# Patient Record
Sex: Female | Born: 1990 | Race: White | Hispanic: No | Marital: Married
Health system: Midwestern US, Community
[De-identification: ages and names within clinical notes are randomized; demographics above are authoritative.]

## PROBLEM LIST (undated history)

## (undated) DIAGNOSIS — E229 Hyperfunction of pituitary gland, unspecified: Secondary | ICD-10-CM

## (undated) DIAGNOSIS — E221 Hyperprolactinemia: Secondary | ICD-10-CM

## (undated) DIAGNOSIS — U071 COVID-19: Secondary | ICD-10-CM

## (undated) DIAGNOSIS — D497 Neoplasm of unspecified behavior of endocrine glands and other parts of nervous system: Secondary | ICD-10-CM

## (undated) HISTORY — PX: NO PAST SURGERIES: SHX2092

---

## 2013-08-31 ENCOUNTER — Encounter

## 2013-09-11 MED ORDER — GADOBUTROL 7.5 MMOL/7.5 ML (1 MMOL/ML) IV
7.5 mmol/ mL (1 mmol/mL) | Freq: Once | INTRAVENOUS | Status: AC
Start: 2013-09-11 — End: 2013-09-11
  Administered 2013-09-11: 15:00:00 via INTRAVENOUS

## 2013-09-11 MED ORDER — SODIUM CHLORIDE 0.9% BOLUS IV
0.9 % | Freq: Once | INTRAVENOUS | Status: AC
Start: 2013-09-11 — End: 2013-09-11
  Administered 2013-09-11: 15:00:00 via INTRAVENOUS

## 2015-10-07 ENCOUNTER — Encounter

## 2015-11-13 ENCOUNTER — Ambulatory Visit: Payer: BLUE CROSS/BLUE SHIELD | Primary: Emergency Medicine

## 2018-01-17 ENCOUNTER — Ambulatory Visit: Admit: 2018-01-17 | Discharge: 2018-01-17 | Payer: PRIVATE HEALTH INSURANCE | Primary: Emergency Medicine

## 2018-01-17 DIAGNOSIS — H6691 Otitis media, unspecified, right ear: Secondary | ICD-10-CM

## 2018-01-17 LAB — AMB POC RAPID STREP A: Group A Strep Ag: NEGATIVE

## 2018-01-17 LAB — AMB POC SOFIA INFLUENZA A/B TEST
Influenza A Ag POC: NEGATIVE Pos/Neg
Influenza B Ag POC: NEGATIVE Pos/Neg

## 2018-01-17 MED ORDER — AMOXICILLIN 875 MG TAB
875 mg | ORAL_TABLET | Freq: Two times a day (BID) | ORAL | 0 refills | Status: AC
Start: 2018-01-17 — End: 2018-01-27

## 2018-01-17 NOTE — Patient Instructions (Addendum)
Sore Throat: Care Instructions  Your Care Instructions    Infection by bacteria or a virus causes most sore throats. Cigarette smoke, dry air, air pollution, allergies, and yelling can also cause a sore throat. Sore throats can be painful and annoying. Fortunately, most sore throats go away on their own. If you have a bacterial infection, your doctor may prescribe antibiotics.  Follow-up care is a key part of your treatment and safety. Be sure to make and go to all appointments, and call your doctor if you are having problems. It's also a good idea to know your test results and keep a list of the medicines you take.  How can you care for yourself at home?  ?? If your doctor prescribed antibiotics, take them as directed. Do not stop taking them just because you feel better. You need to take the full course of antibiotics.  ?? Gargle with warm salt water once an hour to help reduce swelling and relieve discomfort. Use 1 teaspoon of salt mixed in 1 cup of warm water.  ?? Take an over-the-counter pain medicine, such as acetaminophen (Tylenol), ibuprofen (Advil, Motrin), or naproxen (Aleve). Read and follow all instructions on the label.  ?? Be careful when taking over-the-counter cold or flu medicines and Tylenol at the same time. Many of these medicines have acetaminophen, which is Tylenol. Read the labels to make sure that you are not taking more than the recommended dose. Too much acetaminophen (Tylenol) can be harmful.  ?? Drink plenty of fluids. Fluids may help soothe an irritated throat. Hot fluids, such as tea or soup, may help decrease throat pain.  ?? Use over-the-counter throat lozenges to soothe pain. Regular cough drops or hard candy may also help. These should not be given to young children because of the risk of choking.  ?? Do not smoke or allow others to smoke around you. If you need help quitting, talk to your doctor about stop-smoking programs and medicines.  These can increase your chances of quitting for good.  ?? Use a vaporizer or humidifier to add moisture to your bedroom. Follow the directions for cleaning the machine.  When should you call for help?  Call your doctor now or seek immediate medical care if:  ?? ?? You have new or worse trouble swallowing.   ?? ?? Your sore throat gets much worse on one side.   ??Watch closely for changes in your health, and be sure to contact your doctor if you do not get better as expected.  Where can you learn more?  Go to [employer].  Enter [phone] in the search box to learn more about "Sore Throat: Care Instructions."  Current as of: December 07, 2016  Content Version: 11.9  ?? 2006-2018 [employer]. Care instructions adapted under license by [employer] (which disclaims liability or warranty for this information). If you have questions about a medical condition or this instruction, always ask your healthcare professional. [employer] disclaims any warranty or liability for your use of this information.         Ear Infection (Otitis Media): Care Instructions  Your Care Instructions    An ear infection may start with a cold and affect the middle ear (otitis media). It can hurt a lot. Most ear infections clear up on their own in a couple of days. Most often you will not need antibiotics. This is because many ear infections are caused by a virus. Antibiotics don't work against a virus. Regular doses of  pain medicines are the best way to reduce your fever and help you feel better.  Follow-up care is a key part of your treatment and safety. Be sure to make and go to all appointments, and call your doctor if you are having problems. It's also a good idea to know your test results and keep a list of the medicines you take.  How can you care for yourself at home?  ?? Take pain medicines exactly as directed.  ? If the doctor gave you a prescription medicine for pain, take it as  prescribed.  ? If you are not taking a prescription pain medicine, take an over-the-counter medicine, such as acetaminophen (Tylenol), ibuprofen (Advil, Motrin), or naproxen (Aleve). Read and follow all instructions on the label.  ? Do not take two or more pain medicines at the same time unless the doctor told you to. Many pain medicines have acetaminophen, which is Tylenol. Too much acetaminophen (Tylenol) can be harmful.  ?? Plan to take a full dose of pain reliever before bedtime. Getting enough sleep will help you get better.  ?? Try a warm, moist washcloth on the ear. It may help relieve pain.  ?? If your doctor prescribed antibiotics, take them as directed. Do not stop taking them just because you feel better. You need to take the full course of antibiotics.  When should you call for help?  Call your doctor now or seek immediate medical care if:  ?? ?? You have new or increasing ear pain.   ?? ?? You have new or increasing pus or blood draining from your ear.   ?? ?? You have a fever with a stiff neck or a severe headache.   ??Watch closely for changes in your health, and be sure to contact your doctor if:  ?? ?? You have new or worse symptoms.   ?? ?? You are not getting better after taking an antibiotic for 2 days.   Where can you learn more?  Go to http://www.healthwise.net/[employer].  Enter X558 in the search box to learn more about "Ear Infection (Otitis Media): Care Instructions."  Current as of: December 07, 2016  Content Version: 11.9  ?? 2006-2018 [employer]. Care instructions adapted under license by [employer] (which disclaims liability or warranty for this information). If you have questions about a medical condition or this instruction, always ask your healthcare professional. [employer] disclaims any warranty or liability for your use of this information.

## 2018-01-17 NOTE — Progress Notes (Signed)
Cold Symptoms   The history is provided by the patient. This is a new problem. The current episode started yesterday. The problem occurs constantly. The problem has been gradually worsening. The cough is productive of sputum. Patient reports a subjective fever - was not measured.The fever has been present for less than 1 day. Associated symptoms include chills, ear congestion, ear pain, rhinorrhea and sore throat. Pertinent negatives include no chest pain, no sweats, no headaches, no myalgias, no shortness of breath, no wheezing, no nausea and no vomiting. Treatments tried: sudafed. The treatment provided no relief. She is not a smoker.        No past medical history on file.     No past surgical history on file.      No family history on file.     Social History     Socioeconomic History   ??? Marital status: MARRIED     Spouse name: Not on file   ??? Number of children: Not on file   ??? Years of education: Not on file   ??? Highest education level: Not on file   Occupational History   ??? Not on file   Social Needs   ??? Financial resource strain: Not on file   ??? Food insecurity:     Worry: Not on file     Inability: Not on file   ??? Transportation needs:     Medical: Not on file     Non-medical: Not on file   Tobacco Use   ??? Smoking status: Never Smoker   ??? Smokeless tobacco: Never Used   Substance and Sexual Activity   ??? Alcohol use: Not on file   ??? Drug use: Not on file   ??? Sexual activity: Not on file   Lifestyle   ??? Physical activity:     Days per week: Not on file     Minutes per session: Not on file   ??? Stress: Not on file   Relationships   ??? Social connections:     Talks on phone: Not on file     Gets together: Not on file     Attends religious service: Not on file     Active member of club or organization: Not on file     Attends meetings of clubs or organizations: Not on file     Relationship status: Not on file   ??? Intimate partner violence:     Fear of current or ex partner: Not on file      Emotionally abused: Not on file     Physically abused: Not on file     Forced sexual activity: Not on file   Other Topics Concern   ??? Not on file   Social History Narrative   ??? Not on file                ALLERGIES: Patient has no known allergies.    Review of Systems   Constitutional: Positive for chills. Negative for activity change, appetite change and fever.   HENT: Positive for congestion, ear pain, rhinorrhea, sinus pressure, sinus pain and sore throat. Negative for trouble swallowing.    Respiratory: Negative for cough, shortness of breath and wheezing.    Cardiovascular: Negative for chest pain and palpitations.   Gastrointestinal: Negative for nausea and vomiting.   Musculoskeletal: Negative for myalgias.   Neurological: Negative for dizziness and headaches.   Hematological: Positive for adenopathy.       Vitals:  01/17/18 1859   BP: 111/64   Pulse: (!) 114   Resp: 20   Temp: 99.6 ??F (37.6 ??C)   SpO2: 98%   Weight: 164 lb (74.4 kg)   Height:  (1.753 m)       Physical Exam   Constitutional: She appears well-developed and well-nourished. No distress.   HENT:   Right Ear: External ear and ear canal normal. Tympanic membrane is erythematous and bulging. A middle ear effusion is present.   Left Ear: Tympanic membrane, external ear and ear canal normal.   Nose: Rhinorrhea present. Right sinus exhibits no maxillary sinus tenderness and no frontal sinus tenderness. Left sinus exhibits no maxillary sinus tenderness and no frontal sinus tenderness.   Mouth/Throat: Mucous membranes are normal. Posterior oropharyngeal edema and posterior oropharyngeal erythema present. No oropharyngeal exudate or tonsillar abscesses.   Cardiovascular: Normal rate, regular rhythm and normal heart sounds.   Pulmonary/Chest: Effort normal and breath sounds normal. No respiratory distress. She has no wheezes. She has no rales.   Lymphadenopathy:     She has cervical adenopathy.   Neurological: She is alert.    Skin: She is not diaphoretic.   Psychiatric: She has a normal mood and affect. Her behavior is normal. Judgment and thought content normal.   Nursing note and vitals reviewed.      MDM    ICD-10-CM ICD-9-CM    1. Acute right otitis media H66.91 382.9    2. Sore throat J02.9 462 AMB POC RAPID STREP A      AMB POC SOFIA INFLUENZA A/B TEST      UPPER RESPIRATORY CULTURE     Medications Ordered Today   Medications   ??? amoxicillin (AMOXIL) 875 mg tablet     Sig: Take 1 Tab by mouth every twelve (12) hours for 10 days.     Dispense:  20 Tab     Refill:  0     OTC Mucinex prn    The patients condition was discussed with the patient and they understand.  The patient is to follow up with PCP [facility]. If signs and symptoms become worse the pt is to go to the ER. The patient is to take medications as prescribed.       Results for orders placed or performed in visit on 01/17/18   AMB POC RAPID STREP A   Result Value Ref Range    VALID INTERNAL CONTROL POC Yes     Group A Strep Ag Negative Negative   AMB POC SOFIA INFLUENZA A/B TEST   Result Value Ref Range    VALID INTERNAL CONTROL POC Yes     Influenza A Ag POC Negative Negative Pos/Neg    Influenza B Ag POC Negative Negative Pos/Neg         Procedures

## 2018-01-21 LAB — UPPER RESPIRATORY CULTURE

## 2018-06-18 ENCOUNTER — Ambulatory Visit: Admit: 2018-06-18 | Payer: PRIVATE HEALTH INSURANCE | Primary: Emergency Medicine

## 2018-06-18 ENCOUNTER — Ambulatory Visit: Primary: Emergency Medicine

## 2018-06-18 DIAGNOSIS — J029 Acute pharyngitis, unspecified: Secondary | ICD-10-CM

## 2018-06-18 LAB — AMB POC RAPID STREP A
Group A Strep Ag: NEGATIVE
Group A Strep Antigen, POC: NEGATIVE

## 2018-06-18 MED ORDER — PREDNISONE 20 MG TAB
20 mg | ORAL_TABLET | ORAL | 0 refills | Status: DC
Start: 2018-06-18 — End: 2020-11-02

## 2018-06-18 NOTE — Patient Instructions (Signed)
Follow up with PCP without improvement over next 5-7 days sooner/immediately for new or worsening      Results for orders placed or performed in visit on 06/18/18   AMB POC RAPID STREP A   Result Value Ref Range    VALID INTERNAL CONTROL POC Yes     Group A Strep Ag Negative Negative          Sore Throat: Care Instructions  Your Care Instructions    Infection by bacteria or a virus causes most sore throats. Cigarette smoke, dry air, air pollution, allergies, and yelling can also cause a sore throat. Sore throats can be painful and annoying. Fortunately, most sore throats go away on their own. If you have a bacterial infection, your doctor may prescribe antibiotics.  Follow-up care is a key part of your treatment and safety. Be sure to make and go to all appointments, and call your doctor if you are having problems. It's also a good idea to know your test results and keep a list of the medicines you take.  How can you care for yourself at home?  ?? If your doctor prescribed antibiotics, take them as directed. Do not stop taking them just because you feel better. You need to take the full course of antibiotics.  ?? Gargle with warm salt water once an hour to help reduce swelling and relieve discomfort. Use 1 teaspoon of salt mixed in 1 cup of warm water.  ?? Take an over-the-counter pain medicine, such as acetaminophen (Tylenol), ibuprofen (Advil, Motrin), or naproxen (Aleve). Read and follow all instructions on the label.  ?? Be careful when taking over-the-counter cold or flu medicines and Tylenol at the same time. Many of these medicines have acetaminophen, which is Tylenol. Read the labels to make sure that you are not taking more than the recommended dose. Too much acetaminophen (Tylenol) can be harmful.  ?? Drink plenty of fluids. Fluids may help soothe an irritated throat. Hot fluids, such as tea or soup, may help decrease throat pain.  ?? Use over-the-counter throat lozenges to soothe pain. Regular cough drops  or hard candy may also help. These should not be given to young children because of the risk of choking.  ?? Do not smoke or allow others to smoke around you. If you need help quitting, talk to your doctor about stop-smoking programs and medicines. These can increase your chances of quitting for good.  ?? Use a vaporizer or humidifier to add moisture to your bedroom. Follow the directions for cleaning the machine.  When should you call for help?  Call your doctor now or seek immediate medical care if:  ?? ?? You have new or worse trouble swallowing.   ?? ?? Your sore throat gets much worse on one side.   ??Watch closely for changes in your health, and be sure to contact your doctor if you do not get better as expected.  Where can you learn more?  Go to [employer]://www.healthwise.net/[employer].  Enter [phone] in the search box to learn more about "Sore Throat: Care Instructions."  Current as of: July 03, 2017  Content Version: 12.2  ?? 2006-2019 [employer]. Care instructions adapted under license by [employer] (which disclaims liability or warranty for this information). If you have questions about a medical condition or this instruction, always ask your healthcare professional. [employer] disclaims any warranty or liability for your use of this information.

## 2018-06-18 NOTE — Progress Notes (Signed)
27 yo female here for 2 days of sore throat  Pain 3/10. Worse in evenings. Improved throughout the day.  Tolerating food and fluids.  Associated: "mild cough" without SOB or wheeze  hasnt had relief with home therapies  Denies fever, chills, rashes or headache  Overall not improving.           History reviewed. No pertinent past medical history.     History reviewed. No pertinent surgical history.      History reviewed. No pertinent family history.     Social History     Socioeconomic History   ??? Marital status: MARRIED     Spouse name: Not on file   ??? Number of children: Not on file   ??? Years of education: Not on file   ??? Highest education level: Not on file   Occupational History   ??? Not on file   Social Needs   ??? Financial resource strain: Not on file   ??? Food insecurity:     Worry: Not on file     Inability: Not on file   ??? Transportation needs:     Medical: Not on file     Non-medical: Not on file   Tobacco Use   ??? Smoking status: Never Smoker   ??? Smokeless tobacco: Never Used   Substance and Sexual Activity   ??? Alcohol use: Not on file   ??? Drug use: Not on file   ??? Sexual activity: Not on file   Lifestyle   ??? Physical activity:     Days per week: Not on file     Minutes per session: Not on file   ??? Stress: Not on file   Relationships   ??? Social connections:     Talks on phone: Not on file     Gets together: Not on file     Attends religious service: Not on file     Active member of club or organization: Not on file     Attends meetings of clubs or organizations: Not on file     Relationship status: Not on file   ??? Intimate partner violence:     Fear of current or ex partner: Not on file     Emotionally abused: Not on file     Physically abused: Not on file     Forced sexual activity: Not on file   Other Topics Concern   ??? Not on file   Social History Narrative   ??? Not on file                ALLERGIES: Patient has no known allergies.    Review of Systems   Gastrointestinal: Negative for nausea and vomiting.    Skin: Negative for rash.   All other systems reviewed and are negative.      Vitals:    06/18/18 0828   BP: 108/59   Pulse: 88   Resp: 16   Temp: 98 ??F (36.7 ??C)   SpO2: 98%   Weight: 163 lb (73.9 kg)   Height: 5\' 9"  (1.753 m)       Physical Exam   Constitutional: She is oriented to person, place, and time. No distress.   Non-toxic appearing, well hydrated   HENT:   OP erythema without swelling, exudate or lesion. TMs normal bilat. Mild decrease in nasal patency. Uvula midline.   Eyes: Pupils are equal, round, and reactive to light. Conjunctivae and EOM are normal. No scleral icterus.  Neck: Normal range of motion. Neck supple.   Cardiovascular: Normal rate, regular rhythm and normal heart sounds. Exam reveals no gallop and no friction rub.   No murmur heard.  Pulmonary/Chest: Effort normal and breath sounds normal. No respiratory distress. She has no wheezes. She has no rales.   Lymphadenopathy:     She has no cervical adenopathy.   Neurological: She is alert and oriented to person, place, and time.   Skin: Skin is warm and dry. No rash noted. She is not diaphoretic. No erythema. No pallor.   Psychiatric: She has a normal mood and affect. Her behavior is normal. Thought content normal.   Nursing note and vitals reviewed.      MDM     Differential Diagnosis; Clinical Impression; Plan:       CLINICAL IMPRESSION:  (J02.9) Acute pharyngitis, unspecified etiology  (primary encounter diagnosis)    Orders Placed This Encounter      predniSONE (DELTASONE) 20 mg tablet          Sig: Take 2 tabs by mouth one time daily with food for 3 days.          Dispense:  6 Tab          Refill:  0      Plan:  Rapid strep negative  Prednisone with food x 3 days for symptomatic relief  No indication for throat culture today  Soothing, fluids, lozenges  Note given for work  Review handouts    We have reviewed concerning signs/symptoms, normal vs abnormal progression of medical condition and when to seek immediate medical attention.   Schedule with PCP or Urgent Care immediately for worsening or new symptoms.  See your PCP if there is not at least some improvement in symptoms within the next 5-7 days.  You should see your PCP for updates on your routine health maintenance.             Procedures

## 2018-06-18 NOTE — Progress Notes (Signed)
27 yo female here for 2 days of sore throat  Pain 3/10. Worse in evenings. Improved throughout the day.  Tolerating food and fluids.  Associated: "mild cough" without SOB or wheeze  hasnt had relief with home therapies  Denies fever, chills, rashes or headache  Overall not improving.           History reviewed. No pertinent past medical history.     History reviewed. No pertinent surgical history.      History reviewed. No pertinent family history.     Social History     Socioeconomic History   ??? Marital status: MARRIED     Spouse name: Not on file   ??? Number of children: Not on file   ??? Years of education: Not on file   ??? Highest education level: Not on file   Occupational History   ??? Not on file   Social Needs   ??? Financial resource strain: Not on file   ??? Food insecurity:     Worry: Not on file     Inability: Not on file   ??? Transportation needs:     Medical: Not on file     Non-medical: Not on file   Tobacco Use   ??? Smoking status: Never Smoker   ??? Smokeless tobacco: Never Used   Substance and Sexual Activity   ??? Alcohol use: Not on file   ??? Drug use: Not on file   ??? Sexual activity: Not on file   Lifestyle   ??? Physical activity:     Days per week: Not on file     Minutes per session: Not on file   ??? Stress: Not on file   Relationships   ??? Social connections:     Talks on phone: Not on file     Gets together: Not on file     Attends religious service: Not on file     Active member of club or organization: Not on file     Attends meetings of clubs or organizations: Not on file     Relationship status: Not on file   ??? Intimate partner violence:     Fear of current or ex partner: Not on file     Emotionally abused: Not on file     Physically abused: Not on file     Forced sexual activity: Not on file   Other Topics Concern   ??? Not on file   Social History Narrative   ??? Not on file                ALLERGIES: Patient has no known allergies.    Review of Systems   Gastrointestinal: Negative for nausea and vomiting.    Skin: Negative for rash.   All other systems reviewed and are negative.      Vitals:    06/18/18 0828   BP: 108/59   Pulse: 88   Resp: 16   Temp: 98 ??F (36.7 ??C)   SpO2: 98%   Weight: 163 lb (73.9 kg)   Height: 5\' 9"  (1.753 m)       Physical Exam   Constitutional: She is oriented to person, place, and time. No distress.   Non-toxic appearing, well hydrated   HENT:   OP erythema without swelling, exudate or lesion. TMs normal bilat. Mild decrease in nasal patency. Uvula midline.   Eyes: Pupils are equal, round, and reactive to light. Conjunctivae and EOM are normal. No scleral icterus.  Neck: Normal range of motion. Neck supple.   Cardiovascular: Normal rate, regular rhythm and normal heart sounds. Exam reveals no gallop and no friction rub.   No murmur heard.  Pulmonary/Chest: Effort normal and breath sounds normal. No respiratory distress. She has no wheezes. She has no rales.   Lymphadenopathy:     She has no cervical adenopathy.   Neurological: She is alert and oriented to person, place, and time.   Skin: Skin is warm and dry. No rash noted. She is not diaphoretic. No erythema. No pallor.   Psychiatric: She has a normal mood and affect. Her behavior is normal. Thought content normal.   Nursing note and vitals reviewed.      MDM     Differential Diagnosis; Clinical Impression; Plan:       CLINICAL IMPRESSION:  (J02.9) Acute pharyngitis, unspecified etiology  (primary encounter diagnosis)    Orders Placed This Encounter      predniSONE (DELTASONE) 20 mg tablet          Sig: Take 2 tabs by mouth one time daily with food for 3 days.          Dispense:  6 Tab          Refill:  0      Plan:  Rapid strep negative  Prednisone with food x 3 days for symptomatic relief  No indication for throat culture today  Soothing, fluids, lozenges  Note given for work  Review handouts    We have reviewed concerning signs/symptoms, normal vs abnormal progression of medical condition and when to seek immediate medical  attention.  Schedule with PCP or Urgent Care immediately for worsening or new symptoms.  See your PCP if there is not at least some improvement in symptoms within the next 5-7 days.  You should see your PCP for updates on your routine health maintenance.             Procedures

## 2019-03-26 ENCOUNTER — Ambulatory Visit: Admit: 2019-03-26 | Discharge: 2019-03-26 | Payer: PRIVATE HEALTH INSURANCE | Primary: Emergency Medicine

## 2019-03-26 ENCOUNTER — Ambulatory Visit: Primary: Emergency Medicine

## 2019-03-26 DIAGNOSIS — Z1159 Encounter for screening for other viral diseases: Secondary | ICD-10-CM

## 2019-03-26 NOTE — Progress Notes (Signed)
I spoke with the patient/guardian advised of lab results and physician recommendation/instructions.

## 2019-03-26 NOTE — Progress Notes (Signed)
Please inform of negative COVID-19 test.

## 2019-03-26 NOTE — Progress Notes (Signed)
OFFICE NOTE    Nicole Morrow is a 28 y.o. female presenting today for the following:  Chief Complaint   Patient presents with   ??? Covid Testing     pt reports she needs re tested for COVID due to having a positive test          HPI     Patient previously tested positive for COVID and needs to be retested.  Patient denies any abnormal medical symptoms at this time.  Patient denies any SOB or Fever.      Review of Systems   Constitutional: Negative for chills, fatigue and fever.   HENT: Negative.    Respiratory: Negative.    Cardiovascular: Negative.    Gastrointestinal: Negative.    Musculoskeletal: Negative.    Neurological: Negative.          History  History reviewed. No pertinent past medical history.    History reviewed. No pertinent surgical history.    Social History     Socioeconomic History   ??? Marital status: MARRIED     Spouse name: Not on file   ??? Number of children: Not on file   ??? Years of education: Not on file   ??? Highest education level: Not on file   Occupational History   ??? Not on file   Social Needs   ??? Financial resource strain: Not on file   ??? Food insecurity     Worry: Not on file     Inability: Not on file   ??? Transportation needs     Medical: Not on file     Non-medical: Not on file   Tobacco Use   ??? Smoking status: Never Smoker   ??? Smokeless tobacco: Never Used   Substance and Sexual Activity   ??? Alcohol use: Not on file   ??? Drug use: Not on file   ??? Sexual activity: Not on file   Lifestyle   ??? Physical activity     Days per week: Not on file     Minutes per session: Not on file   ??? Stress: Not on file   Relationships   ??? Social Wellsite geologistconnections     Talks on phone: Not on file     Gets together: Not on file     Attends religious service: Not on file     Active member of club or organization: Not on file     Attends meetings of clubs or organizations: Not on file     Relationship status: Not on file   ??? Intimate partner violence     Fear of current or ex partner: Not on file      Emotionally abused: Not on file     Physically abused: Not on file     Forced sexual activity: Not on file   Other Topics Concern   ??? Not on file   Social History Narrative   ??? Not on file       No Known Allergies    Current Outpatient Medications   Medication Sig Dispense Refill   ??? prenatal vit/iron fum/folic ac (PRENATAL 1+1 PO) Prenatal     ??? predniSONE (DELTASONE) 20 mg tablet Take 2 tabs by mouth one time daily with food for 3 days. 6 Tab 0   ??? buPROPion XL (WELLBUTRIN XL) 300 mg XL tablet TAKE 1 TABLET BY MOUTH EVERY DAY  4   ??? levonorgestrel-ethinyl estradiol (LEVORA-28) 0.15-0.03 mg tab Levora-28 0.15 mg-0.03 mg tablet  1 po qd     ??? cabergoline (DOSTINEX) 0.5 mg tablet TAKE 1/2 A TABLET BY MOUTH 2 TIMES A WEEK  3         Patient Care Team:  No care team member to display      LABS:  No results found for any visits on 03/26/19.     RADIOLOGY:  No recent results      Physical Exam  Constitutional:       Appearance: Normal appearance. She is well-developed.   Cardiovascular:      Rate and Rhythm: Normal rate.      Heart sounds: Normal heart sounds.   Pulmonary:      Effort: Pulmonary effort is normal.      Breath sounds: Normal breath sounds.   Neurological:      Mental Status: She is alert and oriented to person, place, and time.   Psychiatric:         Mood and Affect: Mood normal.           Vitals:    03/26/19 1330   Pulse: 86   Resp: 16   Temp: 98.8 ??F (37.1 ??C)   SpO2: 97%   LMP: 03/26/2019         Assessment and Plan    MDM  Number of Diagnoses or Management Options  Screening for viral disease:   Diagnosis management comments: (Z11.59) Screening for viral disease  (primary encounter diagnosis)  No orders of the defined types were placed in this encounter.    Tested patient for COVID-19. Patient given education material.  The condition was discussed with the patient and they understand.  If symptoms worsen the pt is to go to the ER. Advised patient to take tylenol for  discomfort.  Advised to quarantine self.      This patient was seen in Flu Clinic at [facility] while in their vehicle due to COVID-19 pandemic with PPE and focused examination in order to decrease community viral transmission.     The patient/guardian gave verbal consent to treat.                    Procedures

## 2019-03-26 NOTE — Progress Notes (Signed)
OFFICE NOTE    Nicole Morrow is a 28 y.o. female presenting today for the following:  Chief Complaint   Patient presents with   ??? Covid Testing     pt reports she needs re tested for COVID due to having a positive test          HPI     Patient previously tested positive for COVID and needs to be retested.  Patient denies any abnormal medical symptoms at this time.  Patient denies any SOB or Fever.      Review of Systems   Constitutional: Negative for chills, fatigue and fever.   HENT: Negative.    Respiratory: Negative.    Cardiovascular: Negative.    Gastrointestinal: Negative.    Musculoskeletal: Negative.    Neurological: Negative.          History  History reviewed. No pertinent past medical history.    History reviewed. No pertinent surgical history.    Social History     Socioeconomic History   ??? Marital status: MARRIED     Spouse name: Not on file   ??? Number of children: Not on file   ??? Years of education: Not on file   ??? Highest education level: Not on file   Occupational History   ??? Not on file   Social Needs   ??? Financial resource strain: Not on file   ??? Food insecurity     Worry: Not on file     Inability: Not on file   ??? Transportation needs     Medical: Not on file     Non-medical: Not on file   Tobacco Use   ??? Smoking status: Never Smoker   ??? Smokeless tobacco: Never Used   Substance and Sexual Activity   ??? Alcohol use: Not on file   ??? Drug use: Not on file   ??? Sexual activity: Not on file   Lifestyle   ??? Physical activity     Days per week: Not on file     Minutes per session: Not on file   ??? Stress: Not on file   Relationships   ??? Social Product manager on phone: Not on file     Gets together: Not on file     Attends religious service: Not on file     Active member of club or organization: Not on file     Attends meetings of clubs or organizations: Not on file     Relationship status: Not on file   ??? Intimate partner violence     Fear of current or ex partner: Not on file     Emotionally  abused: Not on file     Physically abused: Not on file     Forced sexual activity: Not on file   Other Topics Concern   ??? Not on file   Social History Narrative   ??? Not on file       No Known Allergies    Current Outpatient Medications   Medication Sig Dispense Refill   ??? prenatal vit/iron fum/folic ac (PRENATAL 1+1 PO) Prenatal     ??? predniSONE (DELTASONE) 20 mg tablet Take 2 tabs by mouth one time daily with food for 3 days. 6 Tab 0   ??? buPROPion XL (WELLBUTRIN XL) 300 mg XL tablet TAKE 1 TABLET BY MOUTH EVERY DAY  4   ??? levonorgestrel-ethinyl estradiol (LEVORA-28) 0.15-0.03 mg tab Levora-28 0.15 mg-0.03 mg tablet  1 po qd     ??? cabergoline (DOSTINEX) 0.5 mg tablet TAKE 1/2 A TABLET BY MOUTH 2 TIMES A WEEK  3         Patient Care Team:  No care team member to display      LABS:  No results found for any visits on 03/26/19.     RADIOLOGY:  No recent results      Physical Exam  Constitutional:       Appearance: Normal appearance. She is well-developed.   Cardiovascular:      Rate and Rhythm: Normal rate.      Heart sounds: Normal heart sounds.   Pulmonary:      Effort: Pulmonary effort is normal.      Breath sounds: Normal breath sounds.   Neurological:      Mental Status: She is alert and oriented to person, place, and time.   Psychiatric:         Mood and Affect: Mood normal.           Vitals:    03/26/19 1330   Pulse: 86   Resp: 16   Temp: 98.8 ??F (37.1 ??C)   SpO2: 97%   LMP: 03/26/2019         Assessment and Plan    MDM  Number of Diagnoses or Management Options  Screening for viral disease:   Diagnosis management comments: (Z11.59) Screening for viral disease  (primary encounter diagnosis)  No orders of the defined types were placed in this encounter.    Tested patient for COVID-19. Patient given education material.  The condition was discussed with the patient and they understand.  If symptoms worsen the pt is to go to the ER. Advised patient to take tylenol for discomfort.  Advised to quarantine self.       This patient was seen in Flu Clinic at [facility] while in their vehicle due to COVID-19 pandemic with PPE and focused examination in order to decrease community viral transmission.     The patient/guardian gave verbal consent to treat.                    Procedures

## 2019-03-29 ENCOUNTER — Inpatient Hospital Stay
Admit: 2019-03-29 | Discharge: 2019-03-30 | Disposition: A | Discharge status: Home / Self Care | Payer: BLUE CROSS/BLUE SHIELD | Attending: Emergency Medicine

## 2019-03-29 ENCOUNTER — Emergency Department: Admit: 2019-03-29 | Payer: BLUE CROSS/BLUE SHIELD | Primary: Emergency Medicine

## 2019-03-29 DIAGNOSIS — R0789 Other chest pain: Secondary | ICD-10-CM

## 2019-03-29 LAB — EKG 12-LEAD
Atrial Rate: 94 {beats}/min
Diagnosis: NORMAL
P Axis: 59 degrees
P-R Interval: 192 ms
Q-T Interval: 364 ms
QRS Duration: 86 ms
QTc Calculation (Bazett): 455 ms
R Axis: 37 degrees
T Axis: 16 degrees
Ventricular Rate: 94 {beats}/min

## 2019-03-29 LAB — EKG, 12 LEAD, INITIAL
Atrial Rate: 94 {beats}/min
Calculated P Axis: 59 degrees
Calculated R Axis: 37 degrees
Calculated T Axis: 16 degrees
Diagnosis: NORMAL
P-R Interval: 192 ms
Q-T Interval: 364 ms
QRS Duration: 86 ms
QTC Calculation (Bezet): 455 ms
Ventricular Rate: 94 {beats}/min

## 2019-03-29 NOTE — ED Triage Notes (Signed)
TRIAGE NOTE: Patient arrived from home with c/o left sided chest pain that started 2 hours ago while sitting down. Tested positive for covid on 7/6. Fever and cough on 7/3.

## 2019-03-29 NOTE — ED Provider Notes (Signed)
Date of Service:  03/29/2019    Patient:  Nicole Morrow    Chief Complaint:  Chest Pain       HPI:  Nicole Morrow is a 28 y.o.  female who presents for evaluation of chest pain.  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  She was diagnosed with COVID approximately 10 to 12 days ago.  She has no symptoms of coronavirus currently.  She states that yesterday she had some left jaw pain that caused her to have a headache.  About 3 hours ago she began having left-sided chest pain.  She states that this intermittent nonradiating.  It seems to come and go on its own.  Is not related to breathing or palpation.  She has no shortness of breath.  No other acute complaints or modifying factors.  She does take oral birth control and has no history of blood clots           Past Medical History:   Diagnosis Date   ??? Asthma    ??? COVID-19        History reviewed. No pertinent surgical history.      History reviewed. No pertinent family history.    Social History     Socioeconomic History   ??? Marital status: MARRIED     Spouse name: Not on file   ??? Number of children: Not on file   ??? Years of education: Not on file   ??? Highest education level: Not on file   Occupational History   ??? Not on file   Social Needs   ??? Financial resource strain: Not on file   ??? Food insecurity     Worry: Not on file     Inability: Not on file   ??? Transportation needs     Medical: Not on file     Non-medical: Not on file   Tobacco Use   ??? Smoking status: Never Smoker   ??? Smokeless tobacco: Never Used   Substance and Sexual Activity   ??? Alcohol use: Yes     Frequency: Never   ??? Drug use: Not on file   ??? Sexual activity: Not on file   Lifestyle   ??? Physical activity     Days per week: Not on file     Minutes per session: Not on file   ??? Stress: Not on file   Relationships   ??? Social Product manager on phone: Not on file     Gets together: Not on file     Attends religious service: Not on file      Active member of club or organization: Not on file     Attends meetings of clubs or organizations: Not on file     Relationship status: Not on file   ??? Intimate partner violence     Fear of current or ex partner: Not on file     Emotionally abused: Not on file     Physically abused: Not on file     Forced sexual activity: Not on file   Other Topics Concern   ??? Not on file   Social History Narrative   ??? Not on file         ALLERGIES: Patient has no known allergies.    Review of Systems   Constitutional: Negative for fever.   HENT: Negative for hearing loss.    Eyes: Negative for visual disturbance.  Respiratory: Negative for cough and shortness of breath.    Cardiovascular: Positive for chest pain. Negative for leg swelling.   Gastrointestinal: Negative for abdominal pain.   Genitourinary: Negative for flank pain.   Musculoskeletal: Negative for back pain.   Skin: Negative for rash.   Neurological: Negative for dizziness and light-headedness.   Psychiatric/Behavioral: Negative for confusion.       Vitals:    03/29/19 1917   BP: (!) 136/91   Resp: 21   Temp: 98.8 ??F (37.1 ??C)   SpO2: 99%   Weight: 77.1 kg (169 lb 15.6 oz)   Height: 5\' 9"  (1.753 m)            Physical Exam  Vitals signs and nursing note reviewed.   Constitutional:       General: She is not in acute distress.     Appearance: She is well-developed. She is not ill-appearing, toxic-appearing or diaphoretic.   HENT:      Head: Normocephalic and atraumatic.   Eyes:      General: No scleral icterus.     Conjunctiva/sclera: Conjunctivae normal.      Pupils: Pupils are equal, round, and reactive to light.   Neck:      Musculoskeletal: Neck supple.   Cardiovascular:      Rate and Rhythm: Normal rate and regular rhythm.      Heart sounds: Normal heart sounds. No murmur. No gallop.    Pulmonary:      Effort: Pulmonary effort is normal. No respiratory distress.      Breath sounds: Normal breath sounds. No decreased breath sounds, wheezing, rhonchi or rales.    Chest:      Chest wall: No mass, deformity, tenderness, crepitus or edema.   Abdominal:      General: Bowel sounds are normal. There is no distension.      Palpations: Abdomen is soft.      Tenderness: There is no abdominal tenderness.   Musculoskeletal: Normal range of motion.         General: No tenderness or deformity.      Right lower leg: She exhibits no tenderness. No edema.      Left lower leg: She exhibits no tenderness. No edema.   Skin:     General: Skin is warm and dry.      Capillary Refill: Capillary refill takes less than 2 seconds.      Findings: No rash.   Neurological:      Mental Status: She is alert and oriented to person, place, and time.   Psychiatric:         Behavior: Behavior normal.         Thought Content: Thought content normal.         Judgment: Judgment normal.          MDM  Number of Diagnoses or Management Options  Chest wall pain:     ED Course as of Mar 28 2125   Thu Mar 29, 2019   1932 EKG: 1917  NSR 94  Normal axis/intervals  No acute ischemic change    [GG]   2032 D-dimer: <0.19 [GG]      ED Course User Index  [GG] Sherril CroonGoldman, Suraya Vidrine S, DO     VITAL SIGNS:  Patient Vitals for the past 4 hrs:   Temp Pulse Resp BP SpO2   03/29/19 2045 ??? (!) 101 20 119/76 99 %   03/29/19 2000 ??? 100 13 121/90 100 %  03/29/19 1930 ??? 88 18 122/86 100 %   03/29/19 1917 98.8 ??F (37.1 ??C) ??? 21 (!) 136/91 99 %         LABS:  Recent Results (from the past 6 hour(s))   EKG, 12 LEAD, INITIAL    Collection Time: 03/29/19  7:17 PM   Result Value Ref Range    Ventricular Rate 94 BPM    Atrial Rate 94 BPM    P-R Interval 192 ms    QRS Duration 86 ms    Q-T Interval 364 ms    QTC Calculation (Bezet) 455 ms    Calculated P Axis 59 degrees    Calculated R Axis 37 degrees    Calculated T Axis 16 degrees    Diagnosis       Normal sinus rhythm  Possible Left atrial enlargement  Borderline ECG  No previous ECGs available  Confirmed by Shakevia BonitoAbramson, MD, Stephen ([id number]) on 03/29/2019 7:45:08 PM     CBC WITH AUTOMATED DIFF     Collection Time: 03/29/19  7:52 PM   Result Value Ref Range    WBC 6.0 3.6 - 11.0 K/uL    RBC 4.32 3.80 - 5.20 M/uL    HGB 13.7 11.5 - 16.0 g/dL    HCT 45.440.3 09.835.0 - 11.947.0 %    MCV 93.3 80.0 - 99.0 FL    MCH 31.7 26.0 - 34.0 PG    MCHC 34.0 30.0 - 36.5 g/dL    RDW 14.711.4 (L) 82.911.5 - 14.5 %    PLATELET 292 150 - 400 K/uL    MPV 8.6 (L) 8.9 - 12.9 FL    NRBC 0.0 0 PER 100 WBC    ABSOLUTE NRBC 0.00 0.00 - 0.01 K/uL    NEUTROPHILS 48 32 - 75 %    LYMPHOCYTES 41 12 - 49 %    MONOCYTES 9 5 - 13 %    EOSINOPHILS 2 0 - 7 %    BASOPHILS 1 0 - 1 %    IMMATURE GRANULOCYTES 0 0.0 - 0.5 %    ABS. NEUTROPHILS 2.8 1.8 - 8.0 K/UL    ABS. LYMPHOCYTES 2.5 0.8 - 3.5 K/UL    ABS. MONOCYTES 0.5 0.0 - 1.0 K/UL    ABS. EOSINOPHILS 0.1 0.0 - 0.4 K/UL    ABS. BASOPHILS 0.1 0.0 - 0.1 K/UL    ABS. IMM. GRANS. 0.0 0.00 - 0.04 K/UL    DF AUTOMATED     METABOLIC PANEL, BASIC    Collection Time: 03/29/19  7:52 PM   Result Value Ref Range    Sodium 142 136 - 145 mmol/L    Potassium 3.5 3.5 - 5.1 mmol/L    Chloride 104 97 - 108 mmol/L    CO2 31 21 - 32 mmol/L    Anion gap 7 5 - 15 mmol/L    Glucose 110 (H) 65 - 100 mg/dL    BUN 12 6 - 20 MG/DL    Creatinine 5.621.00 1.300.55 - 1.02 MG/DL    BUN/Creatinine ratio 12 12 - 20      GFR est AA >60 >60 ml/min/1.7973m2    GFR est non-AA >60 >60 ml/min/1.6173m2    Calcium 9.9 8.5 - 10.1 MG/DL   TROPONIN I    Collection Time: 03/29/19  7:52 PM   Result Value Ref Range    Troponin-I, Qt. <0.05 <0.05 ng/mL   D DIMER    Collection Time: 03/29/19  7:52 PM   Result Value Ref Range  D-dimer <0.19 0.00 - 0.65 mg/L FEU   HCG URINE, QL. - POC    Collection Time: 03/29/19  8:01 PM   Result Value Ref Range    Pregnancy test,urine (POC) Negative NEG          IMAGING:  XR CHEST PORT   Final Result   IMPRESSION:   No acute cardiopulmonary disease radiographically..  .               Medications During Visit:  Medications - No data to display      DECISION MAKING:  Nicole Morrow is a 28 y.o. female who comes in as above. Labs and  imaging as above. Reassuring exam. Most likely not cardiac given labs and risk factors. Patient will follow with PCP. All questions answered.       IMPRESSION:  1. Chest wall pain        DISPOSITION:  Discharged      Discharge Medication List as of 03/29/2019  8:48 PM           Follow-up Information     Follow up With Specialties Details Why Contact Info    Dickey GaveBoggs, Melanie, MD Family Practice               The patient is asked to follow-up with their primary care provider in the next several days.  They are to call tomorrow for an appointment.  The patient is asked to return promptly for any increased concerns or worsening of symptoms.  They can return to this emergency department or any other emergency department.    Procedures

## 2019-03-29 NOTE — ED Notes (Signed)
MD at bedside

## 2019-03-29 NOTE — ED Provider Notes (Signed)
Date of Service:  03/29/2019    Patient:  Nicole Morrow    Chief Complaint:  Chest Pain       HPI:  Nicole Morrow is a 28 y.o.  female who presents for evaluation of chest pain.  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.  She was diagnosed with COVID approximately 10 to 12 days ago.  She has no symptoms of coronavirus currently.  She states that yesterday she had some left jaw pain that caused her to have a headache.  About 3 hours ago she began having left-sided chest pain.  She states that this intermittent nonradiating.  It seems to come and go on its own.  Is not related to breathing or palpation.  She has no shortness of breath.  No other acute complaints or modifying factors.  She does take oral birth control and has no history of blood clots           Past Medical History:   Diagnosis Date   ??? Asthma    ??? COVID-19        History reviewed. No pertinent surgical history.      History reviewed. No pertinent family history.    Social History     Socioeconomic History   ??? Marital status: MARRIED     Spouse name: Not on file   ??? Number of children: Not on file   ??? Years of education: Not on file   ??? Highest education level: Not on file   Occupational History   ??? Not on file   Social Needs   ??? Financial resource strain: Not on file   ??? Food insecurity     Worry: Not on file     Inability: Not on file   ??? Transportation needs     Medical: Not on file     Non-medical: Not on file   Tobacco Use   ??? Smoking status: Never Smoker   ??? Smokeless tobacco: Never Used   Substance and Sexual Activity   ??? Alcohol use: Yes     Frequency: Never   ??? Drug use: Not on file   ??? Sexual activity: Not on file   Lifestyle   ??? Physical activity     Days per week: Not on file     Minutes per session: Not on file   ??? Stress: Not on file   Relationships   ??? Social Wellsite geologistconnections     Talks on phone: Not on file     Gets together: Not on file     Attends religious service: Not on file     Active member of club or  organization: Not on file     Attends meetings of clubs or organizations: Not on file     Relationship status: Not on file   ??? Intimate partner violence     Fear of current or ex partner: Not on file     Emotionally abused: Not on file     Physically abused: Not on file     Forced sexual activity: Not on file   Other Topics Concern   ??? Not on file   Social History Narrative   ??? Not on file         ALLERGIES: Patient has no known allergies.    Review of Systems   Constitutional: Negative for fever.   HENT: Negative for hearing loss.    Eyes: Negative for visual disturbance.  Respiratory: Negative for cough and shortness of breath.    Cardiovascular: Positive for chest pain. Negative for leg swelling.   Gastrointestinal: Negative for abdominal pain.   Genitourinary: Negative for flank pain.   Musculoskeletal: Negative for back pain.   Skin: Negative for rash.   Neurological: Negative for dizziness and light-headedness.   Psychiatric/Behavioral: Negative for confusion.       Vitals:    03/29/19 1917   BP: (!) 136/91   Resp: 21   Temp: 98.8 ??F (37.1 ??C)   SpO2: 99%   Weight: 77.1 kg (169 lb 15.6 oz)   Height: 5\' 9"  (1.753 m)            Physical Exam  Vitals signs and nursing note reviewed.   Constitutional:       General: She is not in acute distress.     Appearance: She is well-developed. She is not ill-appearing, toxic-appearing or diaphoretic.   HENT:      Head: Normocephalic and atraumatic.   Eyes:      General: No scleral icterus.     Conjunctiva/sclera: Conjunctivae normal.      Pupils: Pupils are equal, round, and reactive to light.   Neck:      Musculoskeletal: Neck supple.   Cardiovascular:      Rate and Rhythm: Normal rate and regular rhythm.      Heart sounds: Normal heart sounds. No murmur. No gallop.    Pulmonary:      Effort: Pulmonary effort is normal. No respiratory distress.      Breath sounds: Normal breath sounds. No decreased breath sounds, wheezing, rhonchi or rales.   Chest:      Chest wall: No  mass, deformity, tenderness, crepitus or edema.   Abdominal:      General: Bowel sounds are normal. There is no distension.      Palpations: Abdomen is soft.      Tenderness: There is no abdominal tenderness.   Musculoskeletal: Normal range of motion.         General: No tenderness or deformity.      Right lower leg: She exhibits no tenderness. No edema.      Left lower leg: She exhibits no tenderness. No edema.   Skin:     General: Skin is warm and dry.      Capillary Refill: Capillary refill takes less than 2 seconds.      Findings: No rash.   Neurological:      Mental Status: She is alert and oriented to person, place, and time.   Psychiatric:         Behavior: Behavior normal.         Thought Content: Thought content normal.         Judgment: Judgment normal.          MDM  Number of Diagnoses or Management Options  Chest wall pain:     ED Course as of Mar 28 2125   Thu Mar 29, 2019   1932 EKG: 1917  NSR 94  Normal axis/intervals  No acute ischemic change    [GG]   2032 D-dimer: <0.19 [GG]      ED Course User Index  [GG] Sherril CroonGoldman, Anaid Haney S, DO     VITAL SIGNS:  Patient Vitals for the past 4 hrs:   Temp Pulse Resp BP SpO2   03/29/19 2045 ??? (!) 101 20 119/76 99 %   03/29/19 2000 ??? 100 13 121/90 100 %  03/29/19 1930 ??? 88 18 122/86 100 %   03/29/19 1917 98.8 ??F (37.1 ??C) ??? 21 (!) 136/91 99 %         LABS:  Recent Results (from the past 6 hour(s))   EKG, 12 LEAD, INITIAL    Collection Time: 03/29/19  7:17 PM   Result Value Ref Range    Ventricular Rate 94 BPM    Atrial Rate 94 BPM    P-R Interval 192 ms    QRS Duration 86 ms    Q-T Interval 364 ms    QTC Calculation (Bezet) 455 ms    Calculated P Axis 59 degrees    Calculated R Axis 37 degrees    Calculated T Axis 16 degrees    Diagnosis       Normal sinus rhythm  Possible Left atrial enlargement  Borderline ECG  No previous ECGs available  Confirmed by Tayten BonitoAbramson, MD, Stephen ([id number]) on 03/29/2019 7:45:08 PM     CBC WITH AUTOMATED DIFF    Collection Time: 03/29/19   7:52 PM   Result Value Ref Range    WBC 6.0 3.6 - 11.0 K/uL    RBC 4.32 3.80 - 5.20 M/uL    HGB 13.7 11.5 - 16.0 g/dL    HCT 16.140.3 09.635.0 - 04.547.0 %    MCV 93.3 80.0 - 99.0 FL    MCH 31.7 26.0 - 34.0 PG    MCHC 34.0 30.0 - 36.5 g/dL    RDW 40.911.4 (L) 81.111.5 - 14.5 %    PLATELET 292 150 - 400 K/uL    MPV 8.6 (L) 8.9 - 12.9 FL    NRBC 0.0 0 PER 100 WBC    ABSOLUTE NRBC 0.00 0.00 - 0.01 K/uL    NEUTROPHILS 48 32 - 75 %    LYMPHOCYTES 41 12 - 49 %    MONOCYTES 9 5 - 13 %    EOSINOPHILS 2 0 - 7 %    BASOPHILS 1 0 - 1 %    IMMATURE GRANULOCYTES 0 0.0 - 0.5 %    ABS. NEUTROPHILS 2.8 1.8 - 8.0 K/UL    ABS. LYMPHOCYTES 2.5 0.8 - 3.5 K/UL    ABS. MONOCYTES 0.5 0.0 - 1.0 K/UL    ABS. EOSINOPHILS 0.1 0.0 - 0.4 K/UL    ABS. BASOPHILS 0.1 0.0 - 0.1 K/UL    ABS. IMM. GRANS. 0.0 0.00 - 0.04 K/UL    DF AUTOMATED     METABOLIC PANEL, BASIC    Collection Time: 03/29/19  7:52 PM   Result Value Ref Range    Sodium 142 136 - 145 mmol/L    Potassium 3.5 3.5 - 5.1 mmol/L    Chloride 104 97 - 108 mmol/L    CO2 31 21 - 32 mmol/L    Anion gap 7 5 - 15 mmol/L    Glucose 110 (H) 65 - 100 mg/dL    BUN 12 6 - 20 MG/DL    Creatinine 9.141.00 7.820.55 - 1.02 MG/DL    BUN/Creatinine ratio 12 12 - 20      GFR est AA >60 >60 ml/min/1.4473m2    GFR est non-AA >60 >60 ml/min/1.2273m2    Calcium 9.9 8.5 - 10.1 MG/DL   TROPONIN I    Collection Time: 03/29/19  7:52 PM   Result Value Ref Range    Troponin-I, Qt. <0.05 <0.05 ng/mL   D DIMER    Collection Time: 03/29/19  7:52 PM   Result Value Ref Range  D-dimer <0.19 0.00 - 0.65 mg/L FEU   HCG URINE, QL. - POC    Collection Time: 03/29/19  8:01 PM   Result Value Ref Range    Pregnancy test,urine (POC) Negative NEG          IMAGING:  XR CHEST PORT   Final Result   IMPRESSION:   No acute cardiopulmonary disease radiographically..  .               Medications During Visit:  Medications - No data to display      DECISION MAKING:  Venola Castello is a 28 y.o. female who comes in as above. Labs and imaging as above. Reassuring exam.  Most likely not cardiac given labs and risk factors. Patient will follow with PCP. All questions answered.       IMPRESSION:  1. Chest wall pain        DISPOSITION:  Discharged      Discharge Medication List as of 03/29/2019  8:48 PM           Follow-up Information     Follow up With Specialties Details Why Contact Info    Benetta Spar, MD Family Practice               The patient is asked to follow-up with their primary care provider in the next several days.  They are to call tomorrow for an appointment.  The patient is asked to return promptly for any increased concerns or worsening of symptoms.  They can return to this emergency department or any other emergency department.    Procedures

## 2019-03-29 NOTE — ED Notes (Signed)
MD at bedside. 

## 2019-03-29 NOTE — ED Notes (Signed)
TRIAGE NOTE: Patient arrived from home with c/o left sided chest pain that started 2 hours ago while sitting down. Tested positive for covid on 7/6. Fever and cough on 7/3.

## 2019-03-30 LAB — BASIC METABOLIC PANEL
Anion Gap: 7 mmol/L (ref 5–15)
BUN: 12 MG/DL (ref 6–20)
Bun/Cre Ratio: 12 (ref 12–20)
CO2: 31 mmol/L (ref 21–32)
Calcium: 9.9 MG/DL (ref 8.5–10.1)
Chloride: 104 mmol/L (ref 97–108)
Creatinine: 1 MG/DL (ref 0.55–1.02)
EGFR IF NonAfrican American: >60 mL/min/{1.73_m2} (ref >60)
GFR African American: >60 mL/min/{1.73_m2} (ref >60)
Glucose: 110 mg/dL — ABNORMAL HIGH (ref 65–100)
Potassium: 3.5 mmol/L (ref 3.5–5.1)
Sodium: 142 mmol/L (ref 136–145)

## 2019-03-30 LAB — CBC WITH AUTO DIFFERENTIAL
Basophils %: 1 % (ref 0–1)
Basophils Absolute: 0.1 10*3/uL (ref 0.0–0.1)
Eosinophils %: 2 % (ref 0–7)
Eosinophils Absolute: 0.1 10*3/uL (ref 0.0–0.4)
Granulocyte Absolute Count: 0 10*3/uL (ref 0.00–0.04)
Hematocrit: 40.3 % (ref 35.0–47.0)
Hemoglobin: 13.7 g/dL (ref 11.5–16.0)
Immature Granulocytes: 0 % (ref 0.0–0.5)
Lymphocytes %: 41 % (ref 12–49)
Lymphocytes Absolute: 2.5 10*3/uL (ref 0.8–3.5)
MCH: 31.7 PG (ref 26.0–34.0)
MCHC: 34 g/dL (ref 30.0–36.5)
MCV: 93.3 FL (ref 80.0–99.0)
MPV: 8.6 FL — ABNORMAL LOW (ref 8.9–12.9)
Monocytes %: 9 % (ref 5–13)
Monocytes Absolute: 0.5 10*3/uL (ref 0.0–1.0)
NRBC Absolute: 0 10*3/uL (ref 0.00–0.01)
Neutrophils %: 48 % (ref 32–75)
Neutrophils Absolute: 2.8 10*3/uL (ref 1.8–8.0)
Nucleated RBCs: 0 PER 100 WBC
Platelets: 292 10*3/uL (ref 150–400)
RBC: 4.32 M/uL (ref 3.80–5.20)
RDW: 11.4 % — ABNORMAL LOW (ref 11.5–14.5)
WBC: 6 10*3/uL (ref 3.6–11.0)

## 2019-03-30 LAB — D-DIMER, QUANTITATIVE: D-Dimer, Quant: <0.19 mg/L FEU (ref 0.00–0.65)

## 2019-03-30 LAB — HCG URINE, QL. - POC
HCG, Pregnancy, Urine, POC: NEGATIVE
Pregnancy test,urine (POC): NEGATIVE

## 2019-03-30 LAB — TROPONIN: Troponin I: <0.05 ng/mL (ref <0.05)

## 2019-03-30 LAB — CBC WITH AUTOMATED DIFF
ABS. BASOPHILS: 0.1 10*3/uL (ref 0.0–0.1)
ABS. EOSINOPHILS: 0.1 10*3/uL (ref 0.0–0.4)
ABS. IMM. GRANS.: 0 10*3/uL (ref 0.00–0.04)
ABS. LYMPHOCYTES: 2.5 10*3/uL (ref 0.8–3.5)
ABS. MONOCYTES: 0.5 10*3/uL (ref 0.0–1.0)
ABS. NEUTROPHILS: 2.8 10*3/uL (ref 1.8–8.0)
ABSOLUTE NRBC: 0 10*3/uL (ref 0.00–0.01)
BASOPHILS: 1 % (ref 0–1)
EOSINOPHILS: 2 % (ref 0–7)
HCT: 40.3 % (ref 35.0–47.0)
HGB: 13.7 g/dL (ref 11.5–16.0)
IMMATURE GRANULOCYTES: 0 % (ref 0.0–0.5)
LYMPHOCYTES: 41 % (ref 12–49)
MCH: 31.7 PG (ref 26.0–34.0)
MCHC: 34 g/dL (ref 30.0–36.5)
MCV: 93.3 FL (ref 80.0–99.0)
MONOCYTES: 9 % (ref 5–13)
MPV: 8.6 FL — ABNORMAL LOW (ref 8.9–12.9)
NEUTROPHILS: 48 % (ref 32–75)
NRBC: 0 PER 100 WBC
PLATELET: 292 10*3/uL (ref 150–400)
RBC: 4.32 M/uL (ref 3.80–5.20)
RDW: 11.4 % — ABNORMAL LOW (ref 11.5–14.5)
WBC: 6 10*3/uL (ref 3.6–11.0)

## 2019-03-30 LAB — TROPONIN I: Troponin-I, Qt.: <0.05 ng/mL (ref <0.05)

## 2019-03-30 LAB — METABOLIC PANEL, BASIC
Anion gap: 7 mmol/L (ref 5–15)
BUN/Creatinine ratio: 12 (ref 12–20)
BUN: 12 MG/DL (ref 6–20)
CO2: 31 mmol/L (ref 21–32)
Calcium: 9.9 MG/DL (ref 8.5–10.1)
Chloride: 104 mmol/L (ref 97–108)
Creatinine: 1 MG/DL (ref 0.55–1.02)
GFR est AA: >60 mL/min/{1.73_m2} (ref >60)
GFR est non-AA: >60 mL/min/{1.73_m2} (ref >60)
Glucose: 110 mg/dL — ABNORMAL HIGH (ref 65–100)
Potassium: 3.5 mmol/L (ref 3.5–5.1)
Sodium: 142 mmol/L (ref 136–145)

## 2019-03-30 LAB — D DIMER: D-dimer: <0.19 mg/L FEU (ref 0.00–0.65)

## 2019-03-31 LAB — COVID-19: SARS-CoV-2, NAA: NOT DETECTED

## 2019-03-31 LAB — NOVEL CORONAVIRUS (COVID-19): SARS-CoV-2, NAA: NOT DETECTED

## 2020-03-15 ENCOUNTER — Inpatient Hospital Stay (HOSPITAL_COMMUNITY): Payer: 59

## 2020-03-15 ENCOUNTER — Encounter (HOSPITAL_COMMUNITY): Payer: Self-pay | Admitting: Emergency Medicine

## 2020-03-15 ENCOUNTER — Other Ambulatory Visit: Payer: Self-pay

## 2020-03-15 ENCOUNTER — Inpatient Hospital Stay (HOSPITAL_COMMUNITY)
Admission: AD | Admit: 2020-03-15 | Discharge: 2020-03-15 | Disposition: A | Discharge status: Home / Self Care | Payer: 59 | Attending: Emergency Medicine | Admitting: Emergency Medicine

## 2020-03-15 ENCOUNTER — Telehealth: Payer: Self-pay | Admitting: Advanced Practice Midwife

## 2020-03-15 DIAGNOSIS — N83209 Unspecified ovarian cyst, unspecified side: Secondary | ICD-10-CM

## 2020-03-15 DIAGNOSIS — R102 Pelvic and perineal pain: Secondary | ICD-10-CM | POA: Diagnosis not present

## 2020-03-15 DIAGNOSIS — O219 Vomiting of pregnancy, unspecified: Secondary | ICD-10-CM

## 2020-03-15 DIAGNOSIS — O3680X0 Pregnancy with inconclusive fetal viability, not applicable or unspecified: Secondary | ICD-10-CM

## 2020-03-15 DIAGNOSIS — O3481 Maternal care for other abnormalities of pelvic organs, first trimester: Secondary | ICD-10-CM | POA: Insufficient documentation

## 2020-03-15 DIAGNOSIS — Z3A01 Less than 8 weeks gestation of pregnancy: Secondary | ICD-10-CM | POA: Diagnosis not present

## 2020-03-15 DIAGNOSIS — N83202 Unspecified ovarian cyst, left side: Secondary | ICD-10-CM

## 2020-03-15 DIAGNOSIS — O26891 Other specified pregnancy related conditions, first trimester: Secondary | ICD-10-CM | POA: Diagnosis present

## 2020-03-15 DIAGNOSIS — R109 Unspecified abdominal pain: Secondary | ICD-10-CM

## 2020-03-15 HISTORY — DX: Neoplasm of unspecified behavior of endocrine glands and other parts of nervous system: D49.7

## 2020-03-15 LAB — URINALYSIS, ROUTINE W REFLEX MICROSCOPIC
Bilirubin Urine: NEGATIVE
Glucose, UA: NEGATIVE mg/dL
Hgb urine dipstick: NEGATIVE
Ketones, ur: 80 mg/dL — AB
Leukocytes,Ua: NEGATIVE
Nitrite: NEGATIVE
Protein, ur: NEGATIVE mg/dL
Specific Gravity, Urine: 1.021 (ref 1.005–1.030)
pH: 5 (ref 5.0–8.0)

## 2020-03-15 LAB — WET PREP, GENITAL
Clue Cells Wet Prep HPF POC: NONE SEEN
Sperm: NONE SEEN
Trich, Wet Prep: NONE SEEN
Yeast Wet Prep HPF POC: NONE SEEN

## 2020-03-15 LAB — CBC
HCT: 36.5 % (ref 36.0–46.0)
Hemoglobin: 12.4 g/dL (ref 12.0–15.0)
MCH: 30.9 pg (ref 26.0–34.0)
MCHC: 34 g/dL (ref 30.0–36.0)
MCV: 91 fL (ref 80.0–100.0)
Platelets: 228 10*3/uL (ref 150–400)
RBC: 4.01 MIL/uL (ref 3.87–5.11)
RDW: 11 % — ABNORMAL LOW (ref 11.5–15.5)
WBC: 9.8 10*3/uL (ref 4.0–10.5)
nRBC: 0 % (ref 0.0–0.2)

## 2020-03-15 LAB — ABO/RH: ABO/RH(D): O POS

## 2020-03-15 LAB — I-STAT BETA HCG BLOOD, ED (MC, WL, AP ONLY): I-stat hCG, quantitative: >2000 m[IU]/mL — ABNORMAL HIGH (ref <5)

## 2020-03-15 LAB — HCG, QUANTITATIVE, PREGNANCY: hCG, Beta Chain, Quant, S: 96196 m[IU]/mL — ABNORMAL HIGH (ref <5)

## 2020-03-15 MED ORDER — MORPHINE SULFATE (PF) 4 MG/ML IV SOLN
4.0000 mg | Freq: Once | INTRAVENOUS | Status: AC
  Administered 2020-03-15: 4 mg via INTRAMUSCULAR
  Filled 2020-03-15: qty 1

## 2020-03-15 MED ORDER — PROMETHAZINE HCL 25 MG PO TABS: 12.5000 mg | ORAL_TABLET | Freq: Four times a day (QID) | ORAL | 0 refills | Status: DC | PRN

## 2020-03-15 MED ORDER — OXYCODONE-ACETAMINOPHEN 5-325 MG PO TABS: 1.0000 | ORAL_TABLET | Freq: Four times a day (QID) | ORAL | 0 refills | Status: DC | PRN

## 2020-03-15 MED ORDER — OXYCODONE-ACETAMINOPHEN 5-325 MG PO TABS
2.0000 | ORAL_TABLET | Freq: Once | ORAL | Status: AC
  Administered 2020-03-15: 2 via ORAL
  Filled 2020-03-15: qty 2

## 2020-03-15 NOTE — Telephone Encounter (Signed)
Pt called to report she is unable to keep down Percocet prescribed during her MAU visit this morning at 0300 for ovarian cysts in first trimester of pregnancy.  Rx sent for Phenergan 12.5-25 mg Q 6 hours PRN for nausea.  Returned pt call to notify her of Rx.  Pt states understanding.

## 2020-03-15 NOTE — ED Provider Notes (Signed)
[facility] Provider Note   CSN: [id number] Arrival date & time: 03/15/20  0105     History Chief Complaint  Patient presents with  . Abdominal Pain    Christy Castro is a 29 y.o. female.  Patient is a G3P97 29 year old female who is approximately [redacted] weeks pregnant with a chief complaint of lower abdominal/pelvic pain. She states that the pain has been ongoing for most of the day today, but acutely worsened at midnight tonight. She denies any fever, chills, nausea, or vomiting. Denies any vaginal discharge, bleeding, or dysuria. Denies having experienced symptoms like this before. Denies any other associated symptoms. Denies any treatments prior to arrival. She has an OB/GYN located in [place]. She has not yet had a confirmatory IUP on ultrasound.  The history is provided by the patient. No language interpreter was used.       Past Medical History:  Diagnosis Date  . Pituitary tumor     There are no problems to display for this patient.   History reviewed. No pertinent surgical history.   OB History   No obstetric history on file.     No family history on file.  Social History   Tobacco Use  . Smoking status: Never Smoker  . Smokeless tobacco: Never Used  Substance Use Topics  . Alcohol use: Not Currently  . Drug use: Not Currently    Home Medications Prior to Admission medications   Not on File    Allergies    Patient has no known allergies.  Review of Systems   Review of Systems  All other systems reviewed and are negative.   Physical Exam Updated Vital Signs BP 116/84 (BP Location: Left Arm)   Pulse 72   Temp 98 F (36.7 C) (Oral)   Resp (!) 22   Ht 5\' 9"  (1.753 m)   Wt 78 kg   SpO2 100%   BMI 25.40 kg/m   Physical Exam Vitals and nursing note reviewed.  Constitutional:      General: She is not in acute distress.    Appearance: She is well-developed.  HENT:     Head: Normocephalic and  atraumatic.  Eyes:     Conjunctiva/sclera: Conjunctivae normal.  Cardiovascular:     Rate and Rhythm: Normal rate and regular rhythm.     Heart sounds: No murmur heard.   Pulmonary:     Effort: Pulmonary effort is normal. No respiratory distress.     Breath sounds: Normal breath sounds.  Abdominal:     Palpations: Abdomen is soft.     Tenderness: There is abdominal tenderness in the suprapubic area.  Genitourinary:    Comments: Deferred to OBGYN Musculoskeletal:     Cervical back: Neck supple.  Skin:    General: Skin is warm and dry.  Neurological:     Mental Status: She is alert.     ED Results / Procedures / Treatments   Labs (all labs ordered are listed, but only abnormal results are displayed) Labs Reviewed  I-STAT BETA HCG BLOOD, ED ([facility], [facility], [facility] ONLY) - Abnormal; Notable for the following components:      Result Value   I-stat hCG, quantitative >2,000.0 (*)    All other components within normal limits    EKG None  Radiology No results found.  Procedures Procedures (including critical care time)  Medications Ordered in ED Medications - No data to display  ED Course  I have reviewed the triage vital signs  and the nursing notes.  Pertinent labs & imaging results that were available during my care of the patient were reviewed by me and considered in my medical decision making (see chart for details).    MDM Rules/Calculators/A&P                          Patient seen in triage for abdominal pain and pregnancy. She is approximately [redacted] weeks pregnant. She has not had an IUP confirmed on ultrasound. Her OB is located in [place]. She has had severe lower abdominal pain since about midnight tonight. I discussed the case with Janett Billow, the APP for MAU, who accepts patient in transfer.   Final Clinical Impression(s) / ED Diagnoses Final diagnoses:  Abdominal pain during pregnancy in first trimester    Rx / DC Orders ED Discharge Orders    None         Montine Circle, PA-C 03/15/20 0230    Palumbo, April, MD 03/15/20 [phone]

## 2020-03-15 NOTE — ED Triage Notes (Signed)
Patient reports severe abd pain onset earlier today. Patient states she is pregnant, has taken at home pregnancy test. LMP end of May. Denies vaginal bleeding. Tearful in triage

## 2020-03-15 NOTE — MAU Note (Addendum)
Pt here with reports of lower abdominal/pelvic pain that started earlier today but worsened around midnight. Pain is crampy, and comes and goes but is frequent. Took some Tylenol around time pain started but didn;'t help. Pt denies vaginal bleeding. Has some vaginal discharge, but denies itching or odor. Pt also reports some nausea with 1 episode of vomiting just prior to arrival. LMP: 01/25/2020 Gets PNC in [place]. Pt is very tearful in triage.

## 2020-03-15 NOTE — MAU Provider Note (Signed)
History     CSN: [id number]  Arrival date and time: 03/15/20 0105   None     Chief Complaint  Patient presents with  . Abdominal Pain   Christy Castro is a 29 y.o. G1P0 at [redacted]w[redacted]d who by definite LMP of Jan 25, 2020 receives care at [facility] in [place], [place] .  She presents today for Abdominal Pain.  She states she started having dull abdominal pain today that turned into sharp, radiating pain around midnight. She states she tried taking tylenol around 11pm when her pain was a 3/10.  Patient reports her pain is currently a 10/10.  Patient reports the pain is not relieved or aggravated by any known factors. Patient denies vaginal bleeding and reports vaginal discharge that is white and without odor, burning, or irritation.  Patient denies sexual activity in the past 3 days.    OB History    Gravida  1   Para      Term      Preterm      AB      Living        SAB      TAB      Ectopic      Multiple      Live Births              Past Medical History:  Diagnosis Date  . Pituitary tumor     History reviewed. No pertinent surgical history.  No family history on file.  Social History   Tobacco Use  . Smoking status: Never Smoker  . Smokeless tobacco: Never Used  Substance Use Topics  . Alcohol use: Not Currently  . Drug use: Not Currently    Allergies: No Known Allergies  No medications prior to admission.    Review of Systems  Constitutional: Negative for chills and fever.  Gastrointestinal: Positive for abdominal pain, nausea and vomiting. Negative for constipation and diarrhea.  Genitourinary: Positive for pelvic pain and vaginal discharge. Negative for difficulty urinating, dysuria and vaginal bleeding.  Musculoskeletal: Positive for back pain.  Neurological: Positive for dizziness and light-headedness. Negative for headaches.   Physical Exam   Blood pressure 110/70, pulse (!) 58, temperature 97.8 F (36.6 C), temperature source Oral,  resp. rate 20, height 5\' 9"  (1.753 m), weight 80.7 kg, last menstrual period 01/25/2020, SpO2 100 %.  Physical Exam Constitutional:      Appearance: She is well-developed.  HENT:     Head: Normocephalic and atraumatic.  Cardiovascular:     Rate and Rhythm: Normal rate and regular rhythm.     Heart sounds: Normal heart sounds.  Pulmonary:     Effort: Pulmonary effort is normal. No respiratory distress.     Breath sounds: Normal breath sounds.  Abdominal:     General: Abdomen is flat. Bowel sounds are normal. There is no distension.     Palpations: Abdomen is soft.     Tenderness: There is abdominal tenderness in the right lower quadrant, periumbilical area and left lower quadrant.  Skin:    General: Skin is warm and dry.  Neurological:     Mental Status: She is alert and oriented to person, place, and time.     MAU Course  Procedures Results for orders placed or performed during the hospital encounter of 03/15/20 (from the past 24 hour(s))  I-Stat Beta hCG blood, ED ([facility], [facility], [facility] only)     Status: Abnormal   Collection Time: 03/15/20  1:24 AM  Result Value Ref Range   I-stat hCG, quantitative >2,000.0 (H) <5 mIU/mL   Comment 3          ABO/Rh     Status: None   Collection Time: 03/15/20  2:40 AM  Result Value Ref Range   ABO/RH(D) O POS    No rh immune globuloin      NOT A RH IMMUNE GLOBULIN CANDIDATE, PT RH POSITIVE Performed at [facility] [address], [place], [place] [address]   CBC     Status: Abnormal   Collection Time: 03/15/20  2:48 AM  Result Value Ref Range   WBC 9.8 4.0 - 10.5 K/uL   RBC 4.01 3.87 - 5.11 MIL/uL   Hemoglobin 12.4 12.0 - 15.0 g/dL   HCT 36.5 36 - 46 %   MCV 91.0 80.0 - 100.0 fL   MCH 30.9 26.0 - 34.0 pg   MCHC 34.0 30.0 - 36.0 g/dL   RDW 11.0 (L) 11.5 - 15.5 %   Platelets 228 150 - 400 K/uL   nRBC 0.0 0.0 - 0.2 %  hCG, quantitative, pregnancy     Status: Abnormal   Collection Time: 03/15/20  2:48 AM  Result Value Ref Range    hCG, Beta Chain, Quant, S 96,196 (H) <5 mIU/mL  Urinalysis, Routine w reflex microscopic     Status: Abnormal   Collection Time: 03/15/20  3:31 AM  Result Value Ref Range   Color, Urine YELLOW YELLOW   APPearance HAZY (A) CLEAR   Specific Gravity, Urine 1.021 1.005 - 1.030   pH 5.0 5.0 - 8.0   Glucose, UA NEGATIVE NEGATIVE mg/dL   Hgb urine dipstick NEGATIVE NEGATIVE   Bilirubin Urine NEGATIVE NEGATIVE   Ketones, ur 80 (A) NEGATIVE mg/dL   Protein, ur NEGATIVE NEGATIVE mg/dL   Nitrite NEGATIVE NEGATIVE   Leukocytes,Ua NEGATIVE NEGATIVE  Wet prep, genital     Status: Abnormal   Collection Time: 03/15/20  3:38 AM  Result Value Ref Range   Yeast Wet Prep HPF POC NONE SEEN NONE SEEN   Trich, Wet Prep NONE SEEN NONE SEEN   Clue Cells Wet Prep HPF POC NONE SEEN NONE SEEN   WBC, Wet Prep HPF POC MANY (A) NONE SEEN   Sperm NONE SEEN    US OB LESS THAN 14 WEEKS WITH OB TRANSVAGINAL  Result Date: 03/15/2020 CLINICAL DATA:  With 29 year old pregnant female history of pelvic pain. EXAM: OBSTETRIC <14 WK ULTRASOUND TECHNIQUE: Transabdominal ultrasound was performed for evaluation of the gestation as well as the maternal uterus and adnexal regions. COMPARISON:  No priors. FINDINGS: Intrauterine gestational sac: Present Yolk sac:  Present Embryo:  Present Cardiac Activity: Present Heart Rate: 137 bpm CRL:   10.6 mm   7 w 1 d                  [place] EDC: 10/31/2020 Subchorionic hemorrhage:  None visualized. Maternal uterus/adnexae: Degenerating corpus luteum cyst in the right ovary. 2 anechoic lesions with increased through transmission in the left ovary, compatible with simple cysts, measuring 5.9 x 4.6 x 6.3 cm and 6.5 x 5.6 x 6.3 cm. No significant free fluid in the cul-de-sac. IMPRESSION: 1. Single viable IUP with estimated gestational age of [redacted] weeks and 1 day and fetal heart rate of 137 beats per minute. 2. Degenerating corpus luteum cyst in the right ovary. 3. 2 large left ovarian cysts, as above.  Electronically Signed   By: [name].  On: 03/15/2020 05:15    MDM Physical Exam Cultures: Wet Prep and GC/CT Labs: UA, UPT, CBC, hCG, ABO Ultrasound Pain Medication Assessment and Plan  29 year old  G1P0 at 7.1 weeks Abdominal Pain Pregnancy of Unknown Anatomical Location  -POC reviewed. -Labs ordered -Nurse to collect swabs blindly as patient without vaginal bleeding or abnormal discharge. -Will give morphine 4mg  IM for pain. -Will send for [place] and await results.    Maryann Conners 03/15/2020, 3:44 AM   Reassessment (5:30 AM) Ovarian Cysts SIUP at 7.1 weeks  -[place] results as above. -Provider to bedside to review results. -Congratulations given. -Informed of ovarian cysts x 2 on left ovary. -Patient reports continued abdominal pain.  -Will give percocet 2 tablets now and send home with limited script. -Patient questions regarding risks of ovarian cysts during pregnancy addressed. -Informed that unless becomes excessively large or torsion occurs, usually not addressed during pregnancy. -Patient encouraged to contact primary ob for appt regarding recent findings and management. -Patient verbalizes understanding and without further questions or concerns. -Encouraged to call or return to MAU if symptoms worsen or with the onset of new symptoms. -Discharged to home in stable condition.  Maryann Conners MSN, CNM Advanced Practice Provider, [facility]

## 2020-03-15 NOTE — Discharge Instructions (Signed)
Ovarian Cyst     An ovarian cyst is a fluid-filled sac that forms on an ovary. The ovaries are small organs that produce eggs in women. Various types of cysts can form on the ovaries. Some may cause symptoms and require treatment. Most ovarian cysts go away on their own, are not cancerous (are benign), and do not cause problems. Common types of ovarian cysts include:  Functional (follicle) cysts. ? Occur during the menstrual cycle, and usually go away with the next menstrual cycle if you do not get pregnant. ? Usually cause no symptoms.  Endometriomas. ? Are cysts that form from the tissue that lines the uterus (endometrium). ? Are sometimes called "chocolate cysts" because they become filled with blood that turns brown. ? Can cause pain in the lower abdomen during intercourse and during your period.  Cystadenoma cysts. ? Develop from cells on the outside surface of the ovary. ? Can get very large and cause lower abdomen pain and pain with intercourse. ? Can cause severe pain if they twist or break open (rupture).  Dermoid cysts. ? Are sometimes found in both ovaries. ? May contain different kinds of body tissue, such as skin, teeth, hair, or cartilage. ? Usually do not cause symptoms unless they get very big.  Theca lutein cysts. ? Occur when too much of a certain hormone (human chorionic gonadotropin) is produced and overstimulates the ovaries to produce an egg. ? Are most common after having procedures used to assist with the conception of a baby (in vitro fertilization). What are the causes? Ovarian cysts may be caused by:  Ovarian hyperstimulation syndrome. This is a condition that can develop from taking fertility medicines. It causes multiple large ovarian cysts to form.  Polycystic ovarian syndrome (PCOS). This is a common hormonal disorder that can cause ovarian cysts, as well as problems with your period or fertility. What increases the risk? The following factors may  make you more likely to develop ovarian cysts:  Being overweight or obese.  Taking fertility medicines.  Taking certain forms of hormonal birth control.  Smoking. What are the signs or symptoms? Many ovarian cysts do not cause symptoms. If symptoms are present, they may include:  Pelvic pain or pressure.  Pain in the lower abdomen.  Pain during sex.  Abdominal swelling.  Abnormal menstrual periods.  Increasing pain with menstrual periods. How is this diagnosed? These cysts are commonly found during a routine pelvic exam. You may have tests to find out more about the cyst, such as:  Ultrasound.  X-ray of the pelvis.  CT scan.  MRI.  Blood tests. How is this treated? Many ovarian cysts go away on their own without treatment. Your health care provider may want to check your cyst regularly for 2-3 months to see if it changes. If you are in menopause, it is especially important to have your cyst monitored closely because menopausal women have a higher rate of ovarian cancer. When treatment is needed, it may include:  Medicines to help relieve pain.  A procedure to drain the cyst (aspiration).  Surgery to remove the whole cyst.  Hormone treatment or birth control pills. These methods are sometimes used to help dissolve a cyst. Follow these instructions at home:  Take over-the-counter and prescription medicines only as told by your health care provider.  Do not drive or use heavy machinery while taking prescription pain medicine.  Get regular pelvic exams and Pap tests as often as told by your health care provider.    Return to your normal activities as told by your health care provider. Ask your health care provider what activities are safe for you.  Do not use any products that contain nicotine or tobacco, such as cigarettes and e-cigarettes. If you need help quitting, ask your health care provider.  Keep all follow-up visits as told by your health care provider.  This is important. Contact a health care provider if:  Your periods are late, irregular, or painful, or they stop.  You have pelvic pain that does not go away.  You have pressure on your bladder or trouble emptying your bladder completely.  You have pain during sex.  You have any of the following in your abdomen: ? A feeling of fullness. ? Pressure. ? Discomfort. ? Pain that does not go away. ? Swelling.  You feel generally ill.  You become constipated.  You lose your appetite.  You develop severe acne.  You start to have more body hair and facial hair.  You are gaining weight or losing weight without changing your exercise and eating habits.  You think you may be pregnant. Get help right away if:  You have abdominal pain that is severe or gets worse.  You cannot eat or drink without vomiting.  You suddenly develop a fever.  Your menstrual period is much heavier than usual. This information is not intended to replace advice given to you by your health care provider. Make sure you discuss any questions you have with your health care provider. Document Revised: 11/28/2017 Document Reviewed: 02/01/2016 Elsevier Patient Education  2020 Elsevier Inc[employer]  

## 2020-03-18 LAB — TYPE, ABO & RH, EXTERNAL
ABO,Rh: O POS
TYPE, ABO & RH, EXTERNAL: O POS

## 2020-03-18 LAB — GC/CHLAMYDIA PROBE AMP (~~LOC~~) NOT AT ARMC
Chlamydia: NEGATIVE
Comment: NEGATIVE
Comment: NORMAL
Neisseria Gonorrhea: NEGATIVE

## 2020-03-28 LAB — HEPATITIS B SURFACE AG, EXTERNAL
HBSAG, EXTERNAL: NONREACTIVE
HBsAg, External: NONREACTIVE

## 2020-03-28 LAB — RUBELLA AB, IGG, EXTERNAL
RUBELLA, EXTERNAL: IMMUNE
Rubella, External: IMMUNE

## 2020-03-28 LAB — N GONORRHOEAE, DNA PROBE, EXTERNAL
Gonorrhea, External: NEGATIVE
N. GONORRHEA, EXTERNAL: NEGATIVE

## 2020-03-28 LAB — HEPATITIS C AB, EXTERNAL
HEPATITIS C AB,   EXT: NONREACTIVE
Hepatitis C Ab, External: NONREACTIVE

## 2020-03-28 LAB — CHLAMYDIA DNA PROBE, EXTERNAL
CHLAMYDIA, EXTERNAL: NEGATIVE
Chlamydia, External: NEGATIVE

## 2020-03-28 LAB — T PALLIDUM AB, EXTERNAL
T. PALLIDUM, EXTERNAL: NEGATIVE
T. Pallidum Antibody, External: NEGATIVE

## 2020-03-28 LAB — ANTIBODY SCREEN, EXTERNAL
ANTIBODY SCREEN, EXTERNAL: NEGATIVE
Antibody screen, External: NEGATIVE

## 2020-03-28 LAB — HIV-1 AB, EXTERNAL
HIV, EXTERNAL: NONREACTIVE
HIV, External: NONREACTIVE

## 2020-09-13 NOTE — L&D Delivery Note (Signed)
Delivery Summary  Patient: Nicole Morrow             Circumcision:   NA-female  Additional Delivery Comments - Pt pushed x .  Last maternal effort was with so much force the perineum was torn and infant's head rapidly delivered in OA position, nuchal cord x 1 reduced, easy shoulder and delivery remainder of infant's body.  Baby to mother's abdomen and bulb suctioned.  Cord clamping delayed x 2 min.  Spontaneous placenta after 10 mins.      Midline 3C laceration with bilateral vaginal sulcul lacerations identified.  Reviewed lengthy repair with patient as she labored without pain management.  Discussed anesthesia options and pt desired to try local, IV medication and nitrous.  20cc plain lidocaine injected to lacerated areas.  Rectal exam confirmed intact rectal mucosa.  Bleeding was brisk limiting view but EAS was indentified bilaterally and repaired with figure of eight end-to-end stitches of 3-0 vicryl in PISA fashion with excellent reapproximation.  Bilateral sulcul tears were reapproximated.  Remaining 2nd degree laceration then repaired in standard fashion with 3-0 vicryl with restoration of normal anatomy. Rectal exam confirming good perineal bulk and sphincter tone and no suture palpable within the rectum.      Increased PP bleeding due to laceration and uterine atony.  cytotec placed PR.  Fundus firmed and hemostasis improved.    Difficult repair of 12cm laceration.      Information for the patient's newborn:  Marjoria, Mancillas [[id number]]     Delivery Type: Vaginal, Spontaneous   Delivery Date: 10/31/2020   Delivery Time: 6:56 PM     Birth Weight:       Sex:  female  Delivery Clinician:  Orvan Falconer   Gestational Age: [redacted]w[redacted]d    Presentation: Vertex   Position:             Apgars were   and       Resuscitation Method:       Meconium Stained:      Living Status: Living       Placenta Date/Time: 10/31/2020  7:05 PM   Placenta Removal: Spontaneous   Placenta Appearance:  Intact    Cord Information: 3 Vessels    Cord Events: Nuchal Cord Without Compressions       Disposition of Cord Blood: Lab    Blood Gases Sent?:  No       Cord pH:  none    Episiotomy: None   Laceration(s): 3rd     Estimated Blood Loss (ml): clinically , QBL pending    Labor Events  Method: None      Augmentation: None   Cervical Ripening:     None        Operative Vaginal Delivery - none

## 2020-10-03 LAB — GYN RAPID GP B STREP, EXTERNAL
GRBS, EXTERNAL: NEGATIVE
GrBStrep, External: NEGATIVE

## 2020-10-24 ENCOUNTER — Inpatient Hospital Stay: Admit: 2020-10-24 | Payer: PRIVATE HEALTH INSURANCE | Attending: Obstetrics & Gynecology | Primary: Emergency Medicine

## 2020-10-24 ENCOUNTER — Encounter

## 2020-10-24 DIAGNOSIS — Z01812 Encounter for preprocedural laboratory examination: Secondary | ICD-10-CM

## 2020-10-25 LAB — COVID-19: SARS-CoV-2: NOT DETECTED

## 2020-10-25 LAB — SARS-COV-2: SARS-CoV-2: NOT DETECTED

## 2020-10-31 ENCOUNTER — Inpatient Hospital Stay
Admit: 2020-10-31 | Discharge: 2020-11-02 | Disposition: A | Discharge status: Home / Self Care | Payer: PRIVATE HEALTH INSURANCE | Attending: Obstetrics & Gynecology | Admitting: Obstetrics & Gynecology

## 2020-10-31 DIAGNOSIS — O9952 Diseases of the respiratory system complicating childbirth: Principal | ICD-10-CM

## 2020-10-31 LAB — CBC
Hematocrit: 34.9 % — ABNORMAL LOW (ref 35.0–47.0)
Hemoglobin: 11.7 g/dL (ref 11.5–16.0)
MCH: 30.7 PG (ref 26.0–34.0)
MCHC: 33.5 g/dL (ref 30.0–36.5)
MCV: 91.6 FL (ref 80.0–99.0)
MPV: 10.3 FL (ref 8.9–12.9)
NRBC Absolute: 0 10*3/uL (ref 0.00–0.01)
Nucleated RBCs: 0 PER 100 WBC
Platelets: 219 10*3/uL (ref 150–400)
RBC: 3.81 M/uL (ref 3.80–5.20)
RDW: 12.2 % (ref 11.5–14.5)
WBC: 12 10*3/uL — ABNORMAL HIGH (ref 3.6–11.0)

## 2020-10-31 LAB — CBC W/O DIFF
ABSOLUTE NRBC: 0 10*3/uL (ref 0.00–0.01)
HCT: 34.9 % — ABNORMAL LOW (ref 35.0–47.0)
HGB: 11.7 g/dL (ref 11.5–16.0)
MCH: 30.7 PG (ref 26.0–34.0)
MCHC: 33.5 g/dL (ref 30.0–36.5)
MCV: 91.6 FL (ref 80.0–99.0)
MPV: 10.3 FL (ref 8.9–12.9)
NRBC: 0 PER 100 WBC
PLATELET: 219 10*3/uL (ref 150–400)
RBC: 3.81 M/uL (ref 3.80–5.20)
RDW: 12.2 % (ref 11.5–14.5)
WBC: 12 10*3/uL — ABNORMAL HIGH (ref 3.6–11.0)

## 2020-10-31 MED ORDER — NALOXONE 0.4 MG/ML INJECTION
0.4 mg/mL | INTRAMUSCULAR | Status: DC | PRN
Start: 2020-10-31 — End: 2020-10-31

## 2020-10-31 MED ORDER — LACTATED RINGERS BOLUS IV
Freq: Once | INTRAVENOUS | Status: DC
Start: 2020-10-31 — End: 2020-10-31

## 2020-10-31 MED ORDER — OXYTOCIN 0.06 UNIT/ML BOLUS FROM BAG (POST-PARTUM)
30 unit/500 mL | INTRAVENOUS | Status: DC | PRN
Start: 2020-10-31 — End: 2020-10-31

## 2020-10-31 MED ORDER — LIDOCAINE HCL 1 % (10 MG/ML) IJ SOLN
10 mg/mL (1 %) | INTRAMUSCULAR | Status: DC
Start: 2020-10-31 — End: 2020-10-31

## 2020-10-31 MED ORDER — FENTANYL-BUPIVACAINE IN NS (PF) 2 MCG/ML-0.125 % PUMP RESERV,PREFILLED
2 mcg/mL- 0.15 % | EPIDURAL | Status: DC
Start: 2020-10-31 — End: 2020-10-31

## 2020-10-31 MED ORDER — TERBUTALINE 1 MG/ML SUB-Q
1 mg/mL | SUBCUTANEOUS | Status: DC | PRN
Start: 2020-10-31 — End: 2020-10-31

## 2020-10-31 MED ORDER — EPHEDRINE 50 MG/5 ML (10 MG/ML) IN NS IV SYRINGE
50 mg/5 mL (10 mg/mL) | Freq: Once | INTRAVENOUS | Status: DC | PRN
Start: 2020-10-31 — End: 2020-10-31

## 2020-10-31 MED ORDER — LACTATED RINGERS BOLUS IV
INTRAVENOUS | Status: DC | PRN
Start: 2020-10-31 — End: 2020-10-31

## 2020-10-31 MED ORDER — FENTANYL CITRATE (PF) 50 MCG/ML IJ SOLN
50 mcg/mL | Freq: Once | INTRAMUSCULAR | Status: DC
Start: 2020-10-31 — End: 2020-10-31

## 2020-10-31 MED ORDER — BUPIVACAINE (PF) 0.5 % (5 MG/ML) IJ SOLN
0.5 % (5 mg/mL) | INTRAMUSCULAR | Status: DC | PRN
Start: 2020-10-31 — End: 2020-10-31

## 2020-10-31 MED ORDER — BUPIVACAINE (PF) 0.25 % (2.5 MG/ML) IJ SOLN
0.25 % (2.5 mg/mL) | INTRAMUSCULAR | Status: DC
Start: 2020-10-31 — End: 2020-10-31

## 2020-10-31 MED ORDER — EPHEDRINE 50 MG/5 ML (10 MG/ML) IN NS IV SYRINGE
50 mg/5 mL (10 mg/mL) | INTRAVENOUS | Status: DC
Start: 2020-10-31 — End: 2020-10-31

## 2020-10-31 MED ORDER — LIDOCAINE (PF) 10 MG/ML (1 %) IJ SOLN
10 mg/mL (1 %) | INTRAMUSCULAR | Status: DC
Start: 2020-10-31 — End: 2020-10-31

## 2020-10-31 MED ORDER — OXYTOCIN 30 UNIT IN 500 ML INFUSION
30 unit/500 mL | INTRAVENOUS | Status: AC | PRN
Start: 2020-10-31 — End: 2020-10-31
  Administered 2020-11-01: via INTRAVENOUS

## 2020-10-31 MED FILL — FENTANYL-BUPIVACAINE IN NS (PF) 2 MCG/ML-0.125 % PUMP RESERV,PREFILLED: 2 mcg/mL- 0.15 % | EPIDURAL | Qty: 100

## 2020-10-31 MED FILL — LACTATED RINGERS IV: INTRAVENOUS | Qty: 1000

## 2020-10-31 MED FILL — SENSORCAINE-MPF 0.25 % (2.5 MG/ML) INJECTION SOLUTION: 0.25 % (2.5 mg/mL) | INTRAMUSCULAR | Qty: 30

## 2020-10-31 MED FILL — LIDOCAINE HCL 1 % (10 MG/ML) IJ SOLN: 10 mg/mL (1 %) | INTRAMUSCULAR | Qty: 20

## 2020-10-31 MED FILL — OXYTOCIN 30 UNIT IN 500 ML INFUSION: 30 unit/500 mL | INTRAVENOUS | Qty: 500

## 2020-10-31 MED FILL — LIDOCAINE (PF) 10 MG/ML (1 %) IJ SOLN: 10 mg/mL (1 %) | INTRAMUSCULAR | Qty: 5

## 2020-10-31 MED FILL — EPHEDRINE 50 MG/5 ML (10 MG/ML) IN NS IV SYRINGE: 50 mg/5 mL (10 mg/mL) | INTRAVENOUS | Qty: 5

## 2020-10-31 NOTE — H&P (Signed)
Obstetrics Admission History & Physical    Name: Nicole Morrow MRN: [id number]  SSN: [id number]    Date of Birth: May 23, 1991  Age: 30 y.o.  Sex: female      Subjective:     Reason for Admission:  Pregnancy and contractions    History of Present Illness: Nicole Morrow is a 30 y.o. Caucasian female with an estimated gestational age of [redacted]w[redacted]d with Estimated Date of Delivery: 10/31/20. Patient complains of contractions since early this morning. apart and becoming more intense.  Denies LOF, VB.      Pregnancy has been complicated by:  1. Asthma - has remained well controlled without medications  2. UTI with neg TOC  3. 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  COVID neg.  For additional details please see prenatal records.    OB History     Gravida   1    Para        Term        Preterm        AB        Living           SAB        IAB        Ectopic        Molar        Multiple        Live Births                  Past Medical History:   Diagnosis Date   ??? Asthma    ??? COVID-19    ??? 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 Pituitary disorder (HCC)     Benign Pituitary Tumor   ??? Psychiatric problem     Anxiety and Depression     History reviewed. No pertinent surgical history.  Social History     Socioeconomic History   ??? Marital status: MARRIED     Spouse name: Not on file   ??? Number of children: Not on file   ??? Years of education: Not on file   ??? Highest education level: Not on file   Occupational History   ??? Not on file   Tobacco Use   ??? Smoking status: Never Smoker   ??? Smokeless tobacco: Never Used   Substance and Sexual Activity   ??? Alcohol use: Yes   ??? Drug use: Never   ??? Sexual activity: Not on file   Other Topics Concern   ??? Military Service Not Asked   ??? Blood Transfusions Not Asked   ??? Caffeine Concern Not Asked   ??? Occupational Exposure Not Asked   ??? Hobby Hazards Not Asked   ??? Sleep Concern Not Asked   ??? Stress Concern Not Asked   ??? Weight Concern Not Asked   ???  Special Diet Not Asked   ??? Back Care Not Asked   ??? Exercise Not Asked   ??? Bike Helmet Not Asked   ??? Seat Belt Not Asked   ??? Self-Exams Not Asked   Social History Narrative   ??? Not on file     Social Determinants of Health     Financial Resource Strain:    ??? Difficulty of Paying Living Expenses: Not on file   Food Insecurity:    ??? Worried About Running Out of Food in the Last Year: Not on file   ???  Ran Out of Food in the Last Year: Not on file   Transportation Needs:    ??? Lack of Transportation (Medical): Not on file   ??? Lack of Transportation (Non-Medical): Not on file   Physical Activity:    ??? Days of Exercise per Week: Not on file   ??? Minutes of Exercise per Session: Not on file   Stress:    ??? Feeling of Stress : Not on file   Social Connections:    ??? Frequency of Communication with Friends and Family: Not on file   ??? Frequency of Social Gatherings with Friends and Family: Not on file   ??? Attends Religious Services: Not on file   ??? Active Member of Clubs or Organizations: Not on file   ??? Attends Banker Meetings: Not on file   ??? Marital Status: Not on file   Intimate Partner Violence:    ??? Fear of Current or Ex-Partner: Not on file   ??? Emotionally Abused: Not on file   ??? Physically Abused: Not on file   ??? Sexually Abused: Not on file   Housing Stability:    ??? Unable to Pay for Housing in the Last Year: Not on file   ??? Number of Places Lived in the Last Year: Not on file   ??? Unstable Housing in the Last Year: Not on file     History reviewed. No pertinent family history.  Allergies   Allergen Reactions   ??? Gadolinium-Containing Contrast Media Seizures     Prior to Admission medications    Medication Sig Start Date End Date Taking? Authorizing Provider   Omega-3 Fatty Acids 60-90-500 mg cpDR Take  by mouth.   Yes Provider, Historical   prenatal vit/iron fum/folic ac (PRENATAL 1+1 PO) Prenatal   Yes Provider, Historical   predniSONE (DELTASONE) 20 mg tablet Take 2 tabs by mouth one time daily with food  for 3 days.  Patient not taking: Reported on 10/31/2020 06/18/18   Lesia Sago, NP   buPROPion XL (WELLBUTRIN XL) 300 mg XL tablet TAKE 1 TABLET BY MOUTH EVERY DAY  Patient not taking: Reported on 10/31/2020 12/29/17   Provider, Historical   levonorgestrel-ethinyl estradiol (LEVORA-28) 0.15-0.03 mg tab Levora-28 0.15 mg-0.03 mg tablet   1 po qd  Patient not taking: Reported on 10/31/2020    Provider, Historical   cabergoline (DOSTINEX) 0.5 mg tablet TAKE 1/2 A TABLET BY MOUTH 2 TIMES A WEEK  Patient not taking: Reported on 10/31/2020 01/09/18   Provider, Historical        Review of Systems:  A comprehensive review of systems was negative except for that written in the History of Present Illness.     Objective:     Vitals:  Blood pressure (!) 134/98, pulse (!) 102, temperature 98.2 ??F (36.8 ??C), resp. rate 18, height 5\' 9"  (1.753 m), weight 97.5 kg (215 lb), SpO2 99 %, not currently breastfeeding.   Temp (24hrs), Avg:98.2 ??F (36.8 ??C), Min:98.2 ??F (36.8 ??C), Max:98.2 ??F (36.8 ??C)    BP  Min: 134/98  Max: 134/98     Physical Exam:  Patient without distress.  Heart: Regular rate and rhythm  Lung: normal respiratory effort  Abdomen: soft, nontender, gravid  Cervical Exam: 6/90/0  Lower Extremities:  - Edema trace       Membranes:  Intact    Uterine Activity:  Every 3-81min    Fetal Heart Rate:  Reactive     An NST was performed  and was reactive. The baseline FHR was 130. Moderate baseline  variability was noted. Accelerations of sufficient amplitude and duration were noted.  There were no decelerations noted.      Labs: No results found for this or any previous visit (from the past 24 hour(s)).    Assessment and Plan:     G1 at [redacted]w[redacted]d presents in labor,  GBS neg.      - admit to L&D, consent obtained  - Initial BP mildly elevated but pt laboring, suspect pain related, will monitor and send preE labs if persistently elevated  - expectant management, desires unmedicated labor    Signed By:  Orvan Falconer, MD      October 31, 2020

## 2020-10-31 NOTE — Progress Notes (Signed)
 1540. Bedside and Verbal shift change report given to w. Sandridge, rn (Cabin crew) by s. Tonette, rn (offgoing nurse). Report included the following information SBAR, Intake/Output, MAR and Recent Results.     1645. Pt continuing low intervention upright in bed with husband at bedside.     1803. Pt c/o lots of pressure and urge to push. sve by w. Sandridge, rn and Ant lip, +1.     1808. Begin pushing, low intervention.     1856. svd by dr. francia for female newborn. Partial third degree tear.     2120. Pt assisted to bedside commode and voided small amount of urine. pericare instructed and assisted. Pt tolerated well and assisted to wheelchair.     2135. TRANSFER - OUT REPORT:    Verbal report given to freddie, rn(name) on Nicole Morrow  being transferred to miu(unit) for routine progression of care       Report consisted of patient's Situation, Background, Assessment and   Recommendations(SBAR).     Information from the following report(s) SBAR, Intake/Output, MAR and Recent Results was reviewed with the receiving nurse.    Lines:   Peripheral IV 10/31/20 Anterior;Left Forearm (Active)        Opportunity for questions and clarification was provided.      Patient transported with:   Registered Nurse

## 2020-10-31 NOTE — ED Notes (Signed)
10/31/2020 11:49 AM - G1P0 Patient of Dr Dante Gang to [facility] for rule out labor. States that her contractions started this morning and have been about apart for an hour. She was 3/80/-2 in office on 10/22/20.  Doesn't plan for an epidural at this time but will consider it later if needed.    1200 - Dr Elta Guadeloupe notified of patient arrival. In a delivery    1240 - Dr Elta Guadeloupe at bedside, SVE 6/90/0. Plan to admit to L&D. Patient still intact    1253 - Transfer to L&D Rm 2.     1330 - Patient up and ambulating in room. Coping with contractions at this time.    1430 - RN at bedside, patient working through contractions while ambulating in room. Very uncomfortable.    1445 - Dr Elta Guadeloupe at bedside to assess progress. SVE 8/100/0. Offered to AROM, patient declined at this time. Still working through contractions.    1450 - RN tried to offer side lying pelvic release to patient, but was too uncomfortable. Patient prefers to be up and swaying at bedside    1500 - RN remained at bedside, patient swaying while leaning on husband. Coping and breathing during contractions    1510 - RN remained at bedside, patient in knee/chest. Monitors off; FHR reassuring.    1515 - RN doing hip squeezes and shaking apples during contraction    1535 - Bedside shift change report given to Gertie Baron RN (oncoming nurse) by Raiford Noble RN (offgoing nurse). Report included the following information SBAR.

## 2020-11-01 LAB — CBC WITH AUTO DIFFERENTIAL
Basophils %: 0 % (ref 0–1)
Basophils Absolute: 0 10*3/uL (ref 0.0–0.1)
Eosinophils %: 0 % (ref 0–7)
Eosinophils Absolute: 0 10*3/uL (ref 0.0–0.4)
Granulocyte Absolute Count: 0.1 10*3/uL — ABNORMAL HIGH (ref 0.00–0.04)
Hematocrit: 27.6 % — ABNORMAL LOW (ref 35.0–47.0)
Hemoglobin: 9.2 g/dL — ABNORMAL LOW (ref 11.5–16.0)
Immature Granulocytes: 1 % — ABNORMAL HIGH (ref 0.0–0.5)
Lymphocytes %: 14 % (ref 12–49)
Lymphocytes Absolute: 2.6 10*3/uL (ref 0.8–3.5)
MCH: 31.4 PG (ref 26.0–34.0)
MCHC: 33.3 g/dL (ref 30.0–36.5)
MCV: 94.2 FL (ref 80.0–99.0)
MPV: 10.3 FL (ref 8.9–12.9)
Monocytes %: 9 % (ref 5–13)
Monocytes Absolute: 1.7 10*3/uL — ABNORMAL HIGH (ref 0.0–1.0)
NRBC Absolute: 0 10*3/uL (ref 0.00–0.01)
Neutrophils %: 76 % — ABNORMAL HIGH (ref 32–75)
Neutrophils Absolute: 13.9 10*3/uL — ABNORMAL HIGH (ref 1.8–8.0)
Nucleated RBCs: 0 PER 100 WBC
Platelets: 201 10*3/uL (ref 150–400)
RBC: 2.93 M/uL — ABNORMAL LOW (ref 3.80–5.20)
RDW: 12.7 % (ref 11.5–14.5)
WBC: 18.3 10*3/uL — ABNORMAL HIGH (ref 3.6–11.0)

## 2020-11-01 LAB — CBC WITH AUTOMATED DIFF
ABS. BASOPHILS: 0 10*3/uL (ref 0.0–0.1)
ABS. EOSINOPHILS: 0 10*3/uL (ref 0.0–0.4)
ABS. IMM. GRANS.: 0.1 10*3/uL — ABNORMAL HIGH (ref 0.00–0.04)
ABS. LYMPHOCYTES: 2.6 10*3/uL (ref 0.8–3.5)
ABS. MONOCYTES: 1.7 10*3/uL — ABNORMAL HIGH (ref 0.0–1.0)
ABS. NEUTROPHILS: 13.9 10*3/uL — ABNORMAL HIGH (ref 1.8–8.0)
ABSOLUTE NRBC: 0 10*3/uL (ref 0.00–0.01)
BASOPHILS: 0 % (ref 0–1)
EOSINOPHILS: 0 % (ref 0–7)
HCT: 27.6 % — ABNORMAL LOW (ref 35.0–47.0)
HGB: 9.2 g/dL — ABNORMAL LOW (ref 11.5–16.0)
IMMATURE GRANULOCYTES: 1 % — ABNORMAL HIGH (ref 0.0–0.5)
LYMPHOCYTES: 14 % (ref 12–49)
MCH: 31.4 PG (ref 26.0–34.0)
MCHC: 33.3 g/dL (ref 30.0–36.5)
MCV: 94.2 FL (ref 80.0–99.0)
MONOCYTES: 9 % (ref 5–13)
MPV: 10.3 FL (ref 8.9–12.9)
NEUTROPHILS: 76 % — ABNORMAL HIGH (ref 32–75)
NRBC: 0 PER 100 WBC
PLATELET: 201 10*3/uL (ref 150–400)
RBC: 2.93 M/uL — ABNORMAL LOW (ref 3.80–5.20)
RDW: 12.7 % (ref 11.5–14.5)
WBC: 18.3 10*3/uL — ABNORMAL HIGH (ref 3.6–11.0)

## 2020-11-01 MED ORDER — LIDOCAINE (PF) 10 MG/ML (1 %) IJ SOLN
10 mg/mL (1 %) | Freq: Once | INTRAMUSCULAR | Status: AC
Start: 2020-11-01 — End: 2020-10-31
  Administered 2020-11-01: via SUBCUTANEOUS

## 2020-11-01 MED ORDER — MISOPROSTOL 200 MCG TAB
200 mcg | ORAL | Status: DC
Start: 2020-11-01 — End: 2020-10-31

## 2020-11-01 MED ORDER — ACETAMINOPHEN 325 MG TABLET
325 mg | ORAL | Status: DC | PRN
Start: 2020-11-01 — End: 2020-11-02
  Administered 2020-11-01 – 2020-11-02 (×2): via ORAL

## 2020-11-01 MED ORDER — DOCUSATE SODIUM 100 MG CAP
100 mg | Freq: Two times a day (BID) | ORAL | Status: DC
Start: 2020-11-01 — End: 2020-11-02
  Administered 2020-11-01 – 2020-11-02 (×4): via ORAL

## 2020-11-01 MED ORDER — MISOPROSTOL 200 MCG TAB
200 mcg | ORAL | Status: AC
Start: 2020-11-01 — End: 2020-10-31
  Administered 2020-11-01: 01:00:00 via RECTAL

## 2020-11-01 MED ORDER — DIPHTH,PERTUS(AC)TETANUS VAC(PF) 2.5 LF UNIT-8 MCG-5 LF/0.5 ML INJ
INTRAMUSCULAR | Status: DC
Start: 2020-11-01 — End: 2020-11-02

## 2020-11-01 MED ORDER — ALUMINUM-MAGNESIUM HYDROXIDE 200 MG-200 MG/5 ML ORAL SUSP
200-200 mg/5 mL | ORAL | Status: DC | PRN
Start: 2020-11-01 — End: 2020-11-02

## 2020-11-01 MED ORDER — IBUPROFEN 400 MG TAB
400 mg | Freq: Three times a day (TID) | ORAL | Status: DC
Start: 2020-11-01 — End: 2020-11-02
  Administered 2020-11-01 – 2020-11-02 (×6): via ORAL

## 2020-11-01 MED ORDER — HYDROCORTISONE 1 % TOPICAL CREAM
1 % | CUTANEOUS | Status: DC | PRN
Start: 2020-11-01 — End: 2020-11-02

## 2020-11-01 MED ORDER — FENTANYL CITRATE (PF) 50 MCG/ML IJ SOLN
50 mcg/mL | Freq: Once | INTRAMUSCULAR | Status: AC
Start: 2020-11-01 — End: 2020-10-31
  Administered 2020-11-01: via INTRAVENOUS

## 2020-11-01 MED ORDER — AMMONIA AROMATIC 15 % (W/V) SOLN FOR INHALATION
15 % (w/v) | RESPIRATORY_TRACT | Status: DC | PRN
Start: 2020-11-01 — End: 2020-11-02

## 2020-11-01 MED ORDER — SODIUM CHLORIDE 0.9 % IJ SYRG
INTRAMUSCULAR | Status: DC | PRN
Start: 2020-11-01 — End: 2020-11-02

## 2020-11-01 MED ORDER — LACTULOSE 10 GRAM/15 ML ORAL SOLUTION
10 gram/15 mL | Freq: Every day | ORAL | Status: DC | PRN
Start: 2020-11-01 — End: 2020-11-02

## 2020-11-01 MED ORDER — OXYCODONE-ACETAMINOPHEN 5 MG-325 MG TAB
5-325 mg | ORAL | Status: DC | PRN
Start: 2020-11-01 — End: 2020-11-02

## 2020-11-01 MED ORDER — LIDOCAINE HCL 1 % (10 MG/ML) IJ SOLN
10 mg/mL (1 %) | INTRAMUSCULAR | Status: DC
Start: 2020-11-01 — End: 2020-10-31

## 2020-11-01 MED ORDER — HYDROCORTISONE-PRAMOXINE 2.5 %-1 % (4G) RECTAL CREAM
RECTAL | Status: DC | PRN
Start: 2020-11-01 — End: 2020-11-02

## 2020-11-01 MED ORDER — IBUPROFEN 800 MG TAB
800 mg | ORAL_TABLET | Freq: Three times a day (TID) | ORAL | 0 refills | Status: AC | PRN
Start: 2020-11-01 — End: ?

## 2020-11-01 MED ORDER — DIPHENHYDRAMINE 25 MG CAP
25 mg | ORAL | Status: DC | PRN
Start: 2020-11-01 — End: 2020-11-02

## 2020-11-01 MED ORDER — GLYCERIN-WITCH HAZEL 12.5 %-50 % TOPICAL PADS
CUTANEOUS | Status: DC | PRN
Start: 2020-11-01 — End: 2020-11-02

## 2020-11-01 MED ORDER — MELATONIN 3 MG TAB
3 mg | Freq: Every evening | ORAL | Status: DC | PRN
Start: 2020-11-01 — End: 2020-11-02

## 2020-11-01 MED ORDER — OXYTOCIN 30 UNIT IN 500 ML INFUSION
30 unit/500 mL | INTRAVENOUS | Status: DC | PRN
Start: 2020-11-01 — End: 2020-11-02

## 2020-11-01 MED ORDER — RHO D IMMUNE GLOBULIN 300 MCG IM SYRG
1500 unit (300 mcg) | INTRAMUSCULAR | Status: DC
Start: 2020-11-01 — End: 2020-11-02

## 2020-11-01 MED ORDER — SIMETHICONE 80 MG CHEWABLE TAB
80 mg | Freq: Four times a day (QID) | ORAL | Status: DC | PRN
Start: 2020-11-01 — End: 2020-11-02

## 2020-11-01 MED ORDER — MODIFIED LANOLIN 100 % TOPICAL CREAM
100 % | CUTANEOUS | Status: DC | PRN
Start: 2020-11-01 — End: 2020-11-02

## 2020-11-01 MED ORDER — MEASLES,MUMPS&RUBELLA VACCIN(PF) 1,000-12,500 TCID50/0.5 ML SUB-Q SUSP
1000-12500 TCID50/0.5 mL | SUBCUTANEOUS | Status: DC
Start: 2020-11-01 — End: 2020-11-02

## 2020-11-01 MED ORDER — SODIUM CHLORIDE 0.9 % IJ SYRG
Freq: Three times a day (TID) | INTRAMUSCULAR | Status: DC
Start: 2020-11-01 — End: 2020-11-02
  Administered 2020-11-01 – 2020-11-02 (×3): via INTRAVENOUS

## 2020-11-01 MED ORDER — OXYTOCIN 0.06 UNIT/ML BOLUS FROM BAG (POST-PARTUM)
30 unit/500 mL | INTRAVENOUS | Status: DC | PRN
Start: 2020-11-01 — End: 2020-11-02

## 2020-11-01 MED FILL — MODIFIED LANOLIN 100 % TOPICAL CREAM: 100 % | CUTANEOUS | Qty: 7

## 2020-11-01 MED FILL — MISOPROSTOL 200 MCG TAB: 200 mcg | ORAL | Qty: 4

## 2020-11-01 MED FILL — MELATONIN 3 MG TAB: 3 mg | ORAL | Qty: 1

## 2020-11-01 MED FILL — HYDROCORTISONE 1 % TOPICAL CREAM: 1 % | CUTANEOUS | Qty: 28

## 2020-11-01 MED FILL — ACETAMINOPHEN 325 MG TABLET: 325 mg | ORAL | Qty: 2

## 2020-11-01 MED FILL — HYDROCORTISONE-PRAMOXINE 2.5 %-1 % (4G) RECTAL CREAM: RECTAL | Qty: 4

## 2020-11-01 MED FILL — LIDOCAINE HCL 1 % (10 MG/ML) IJ SOLN: 10 mg/mL (1 %) | INTRAMUSCULAR | Qty: 20

## 2020-11-01 MED FILL — IBUPROFEN 400 MG TAB: 400 mg | ORAL | Qty: 2

## 2020-11-01 MED FILL — DOK 100 MG CAPSULE: 100 mg | ORAL | Qty: 1

## 2020-11-01 MED FILL — GLYCERIN-WITCH HAZEL 12.5 %-50 % TOPICAL PADS: CUTANEOUS | Qty: 1

## 2020-11-01 MED FILL — NORMAL SALINE FLUSH 0.9 % INJECTION SYRINGE: INTRAMUSCULAR | Qty: 40

## 2020-11-01 MED FILL — FENTANYL CITRATE (PF) 50 MCG/ML IJ SOLN: 50 mcg/mL | INTRAMUSCULAR | Qty: 2

## 2020-11-01 MED FILL — LACTULOSE 20 GRAM/30 ML ORAL SOLUTION: 20 gram/30 mL | ORAL | Qty: 30

## 2020-11-01 NOTE — Discharge Summary (Signed)
Obstetrical Discharge Summary     Name: Nicole Morrow MRN: [id number]  SSN: [id number]    Date of Birth: 1991-09-03  Age: 30 y.o.  Sex: female      Admit Date: 10/31/2020    Discharge Date: 11/02/20     Admitting Physician: Orvan Falconer, MD     Attending Physician:  Garnette Gunner, Ali Lowe,*     Admission Diagnoses: Normal labor [O80, Z37.9]    Discharge Diagnoses:   Information for the patient's newborn:  Vernia, Teem [[id number]]   Delivery of a 3.816 kg female infant via Vaginal, Spontaneous on 10/31/2020 at 6:56 PM  by Marlaine Hind Clipp. Apgars were 9  and 9 .       Additional Diagnoses:   Hospital Problems  Never Reviewed          Codes Class Noted POA    Normal labor ICD-10-CM: O15, Z37.9  ICD-9-CM: 650  10/31/2020 Unknown             Lab Results   Component Value Date/Time    Rubella, External Immune 03/28/2020 12:00 AM    GrBStrep, External Negative 10/03/2020 12:00 AM       Hospital Course: Normal hospital course following the delivery.    - elevated BP upon arrival, normalized after delivery, cont to monitor  - 3rd degree lac - bowel regimen reviewed w pt  - PPH - EBL 800, Hgb 9.2 (11.7), iron at DC  ??    Patient Instructions:   Current Discharge Medication List      START taking these medications    Details   ibuprofen (MOTRIN) 800 mg tablet Take 1 Tablet by mouth every eight (8) hours as needed for Pain.  Qty: 30 Tablet, Refills: 0         CONTINUE these medications which have NOT CHANGED    Details   Omega-3 Fatty Acids 60-90-500 mg cpDR Take  by mouth.      prenatal vit/iron fum/folic ac (PRENATAL 1+1 PO) Prenatal         STOP taking these medications       predniSONE (DELTASONE) 20 mg tablet Comments:   Reason for Stopping:         buPROPion XL (WELLBUTRIN XL) 300 mg XL tablet Comments:   Reason for Stopping:         levonorgestrel-ethinyl estradiol (LEVORA-28) 0.15-0.03 mg tab Comments:   Reason for Stopping:         cabergoline (DOSTINEX) 0.5 mg tablet Comments:   Reason for  Stopping:               Disposition at Discharge: Home or self care    Condition at Discharge: Stable    Reference my discharge instructions.    Follow-up Appointments   Procedures   ??? FOLLOW UP VISIT Appointment in: Two Weeks Urogyn visit for 3rd dgree 6w - routine follow up     Urogyn visit for 3rd dgree  6w - routine follow up     Standing Status:   Standing     Number of Occurrences:   1     Order Specific Question:   Appointment in     Answer:   Two Weeks        Signed By:  Larence Penning, MD     November 01, 2020                      .

## 2020-11-01 NOTE — Consults (Signed)
This note was copied from a baby's chart.  Baby has been sleepy but last feeding nursed well for 30 minutes. Mom feeding more comfortable with positioning baby. Discussed ways to awaken a sleepy baby and frequency and duration of feedings.  All questions answered.

## 2020-11-01 NOTE — Consults (Signed)
This note was copied from a baby's chart.  Initial Lactation Consultation -  Baby born vaginally this evening to a G1 P1 mom at [redacted] weeks gestation. Mom has a history of a prolactinoma. She noticed breast changes and has been able to express drops of colostrum. Mom states baby latched and nursed well after delivery. Baby was sleeping at the time of my visit.    Feeding Plan:  Mother will keep baby skin to skin as often as possible, feed on demand, respond to feeding cues, obtain latch, listen for audible swallowing, be aware of signs of oxytocin release/ cramping, thirst and sleepiness while breastfeeding. Mom will not limit the time the baby is at the breast. She will allow the baby to completely finish one breast and then offer the second breast at each feeding.

## 2020-11-01 NOTE — Progress Notes (Signed)
Post-Partum Day Number 1 Progress Note    Nicole Morrow     Assessment: Doing well, post partum day 1    Plan:  - Continue routine postpartum and perineal care as well as maternal education.  - elevated BP upon arrival, normalized after delivery, cont to monitor  - 3rd degree lac - bowel regimen reviewed w pt  - PPH - EBL 800, Hgb 9.2 (11.7), iron at DC    - Plan discharge home [facility] Discharge Date: Tomorrow.    Information for the patient's newborn:  Erza, Mothershead [[id number]]   Vaginal, Spontaneous    Patient doing well without significant complaint.  Voiding without difficulty, normal lochia.    Vitals:  Visit Vitals  BP 133/82 (BP 1 Location: Right upper arm)   Pulse 84   Temp 99 ??F (37.2 ??C) Comment: oral   Resp 16   Ht 5\' 9"  (1.753 m)   Wt 97.5 kg (215 lb)   SpO2 98%   Breastfeeding Unknown   BMI 31.75 kg/m??     Temp (24hrs), Avg:98.7 ??F (37.1 ??C), Min:97.6 ??F (36.4 ??C), Max:99.7 ??F (37.6 ??C)        Exam:   Patient without distress.                  Fundus firm, nontender                Perineum with normal lochia noted per nursing assessment.                Lower extremities are negative for pathological edema.    Labs:     Lab Results   Component Value Date/Time    WBC 18.3 (H) 11/01/2020 04:46 AM    WBC 12.0 (H) 10/31/2020 01:44 PM    WBC 6.0 03/29/2019 07:52 PM    HGB 9.2 (L) 11/01/2020 04:46 AM    HGB 11.7 10/31/2020 01:44 PM    HGB 13.7 03/29/2019 07:52 PM    HCT 27.6 (L) 11/01/2020 04:46 AM    HCT 34.9 (L) 10/31/2020 01:44 PM    HCT 40.3 03/29/2019 07:52 PM    PLATELET 201 11/01/2020 04:46 AM    PLATELET 219 10/31/2020 01:44 PM    PLATELET 292 03/29/2019 07:52 PM       Recent Results (from the past 24 hour(s))   CBC W/O DIFF    Collection Time: 10/31/20  1:44 PM   Result Value Ref Range    WBC 12.0 (H) 3.6 - 11.0 K/uL    RBC 3.81 3.80 - 5.20 M/uL    HGB 11.7 11.5 - 16.0 g/dL    HCT 11/02/20 (L) 65.4 - 47.0 %    MCV 91.6 80.0 - 99.0 FL    MCH 30.7 26.0 - 34.0 PG    MCHC 33.5 30.0 - 36.5 g/dL    RDW  65.0 35.4 - 65.6 %    PLATELET 219 150 - 400 K/uL    MPV 10.3 8.9 - 12.9 FL    NRBC 0.0 0 PER 100 WBC    ABSOLUTE NRBC 0.00 0.00 - 0.01 K/uL   CBC WITH AUTOMATED DIFF    Collection Time: 11/01/20  4:46 AM   Result Value Ref Range    WBC 18.3 (H) 3.6 - 11.0 K/uL    RBC 2.93 (L) 3.80 - 5.20 M/uL    HGB 9.2 (L) 11.5 - 16.0 g/dL    HCT 11/03/20 (L) 75.1 - 47.0 %    MCV  94.2 80.0 - 99.0 FL    MCH 31.4 26.0 - 34.0 PG    MCHC 33.3 30.0 - 36.5 g/dL    RDW 13.0 86.5 - 78.4 %    PLATELET 201 150 - 400 K/uL    MPV 10.3 8.9 - 12.9 FL    NRBC 0.0 0 PER 100 WBC    ABSOLUTE NRBC 0.00 0.00 - 0.01 K/uL    NEUTROPHILS 76 (H) 32 - 75 %    LYMPHOCYTES 14 12 - 49 %    MONOCYTES 9 5 - 13 %    EOSINOPHILS 0 0 - 7 %    BASOPHILS 0 0 - 1 %    IMMATURE GRANULOCYTES 1 (H) 0.0 - 0.5 %    ABS. NEUTROPHILS 13.9 (H) 1.8 - 8.0 K/UL    ABS. LYMPHOCYTES 2.6 0.8 - 3.5 K/UL    ABS. MONOCYTES 1.7 (H) 0.0 - 1.0 K/UL    ABS. EOSINOPHILS 0.0 0.0 - 0.4 K/UL    ABS. BASOPHILS 0.0 0.0 - 0.1 K/UL    ABS. IMM. GRANS. 0.1 (H) 0.00 - 0.04 K/UL    DF AUTOMATED

## 2020-11-01 NOTE — Progress Notes (Signed)
Bedside shift change report given to K. Church RN (oncoming nurse) by F. Ferguson RN (offgoing nurse). Report included the following information SBAR.

## 2020-11-02 MED FILL — IBUPROFEN 400 MG TAB: 400 mg | ORAL | Qty: 2

## 2020-11-02 MED FILL — DOK 100 MG CAPSULE: 100 mg | ORAL | Qty: 1

## 2020-11-02 MED FILL — ACETAMINOPHEN 325 MG TABLET: 325 mg | ORAL | Qty: 2

## 2020-11-02 NOTE — Consults (Signed)
This note was copied from a baby's chart.  Follow up-  For discharge today. Baby cluster fed during the night. Nipples sore but look okay. We reviewed how to get a good latch. Latch looks good. Teaching completed. Breasts may become engorged when milk "comes in".  How milk is made / normal phases of milk production, supply and demand discussed.  Taught care of engorged breasts - frequent breastfeeding encouraged. Mom should put the baby to the breast and allow him to completely finish one breast before offering the second breast. She may pump a couple minutes after nursing for comfort. She can apply ice to the breasts for 10-15 minutes after nursing as needed.  All questions answered.

## 2020-11-02 NOTE — Progress Notes (Signed)
 Bedside shift change report given to [employer] ([employer]) by Alease Medina RN (offgoing nurse). Report included the following information SBAR.

## 2020-11-02 NOTE — Progress Notes (Signed)
Post-Partum Day Number 2 Progress Note    Nicole Morrow     Assessment: Doing well, post partum day 2    Plan:   - Discharge home today    - elevated BP upon arrival, normalized after delivery, cont to monitor  - 3rd degree lac - bowel regimen reviewed w pt  - PPH - EBL 800, Hgb 9.2 (11.7), iron at DC    - Follow up in office in 6 week(s) with [facility] for urogyn  - Pain medication prescription(s) sent.  - Questions answered.    Information for the patient's newborn:  [name], [name] [[id number]]   Vaginal, Spontaneous    Patient doing well without significant complaint.  Voiding without difficulty, normal lochia. Ready for discharge home.    Vitals:  Visit Vitals  BP 128/78 (BP 1 Location: Right upper arm)   Pulse 95   Temp 98.5 ??F (36.9 ??C)   Resp 16   Ht 5\' 9"  (1.753 m)   Wt 97.5 kg (215 lb)   SpO2 98%   Breastfeeding Unknown   BMI 31.75 kg/m??     Temp (24hrs), Avg:98.6 ??F (37 ??C), Min:98.5 ??F (36.9 ??C), Max:98.7 ??F (37.1 ??C)      Exam:        Patient without distress.                Fundus firm, nontender per nursing fundal checks                Perineum with normal lochia noted per nursing assessment                Lower extremities are negative for pathological edema    Labs:     Lab Results   Component Value Date/Time    WBC 18.3 (H) 11/01/2020 04:46 AM    WBC 12.0 (H) 10/31/2020 01:44 PM    WBC 6.0 03/29/2019 07:52 PM    HGB 9.2 (L) 11/01/2020 04:46 AM    HGB 11.7 10/31/2020 01:44 PM    HGB 13.7 03/29/2019 07:52 PM    HCT 27.6 (L) 11/01/2020 04:46 AM    HCT 34.9 (L) 10/31/2020 01:44 PM    HCT 40.3 03/29/2019 07:52 PM    PLATELET 201 11/01/2020 04:46 AM    PLATELET 219 10/31/2020 01:44 PM    PLATELET 292 03/29/2019 07:52 PM       No results found for this or any previous visit (from the past 24 hour(s)).

## 2021-01-27 IMAGING — US US OB < 14 WEEKS - US OB TV
1 series · 15 of 28 positions shown · non-contrast
Comparison: No priors.

CLINICAL DATA: With 28-year-old pregnant female history of pelvic
pain.

EXAM:
OBSTETRIC <14 WK ULTRASOUND
TECHNIQUE: Transabdominal ultrasound was performed for evaluation of the
gestation as well as the maternal uterus and adnexal regions.

[Series 1: us ob < 14 weeks - us ob tv · 72 acquisitions, 15 frames shown]
[im 1/72]
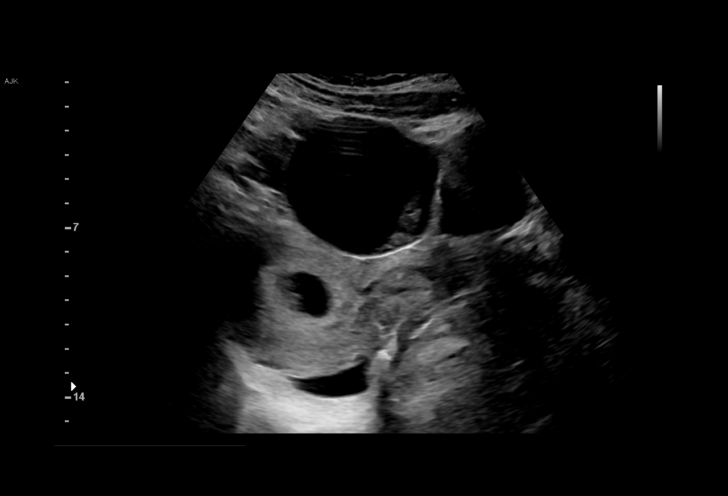
[im 6/72]
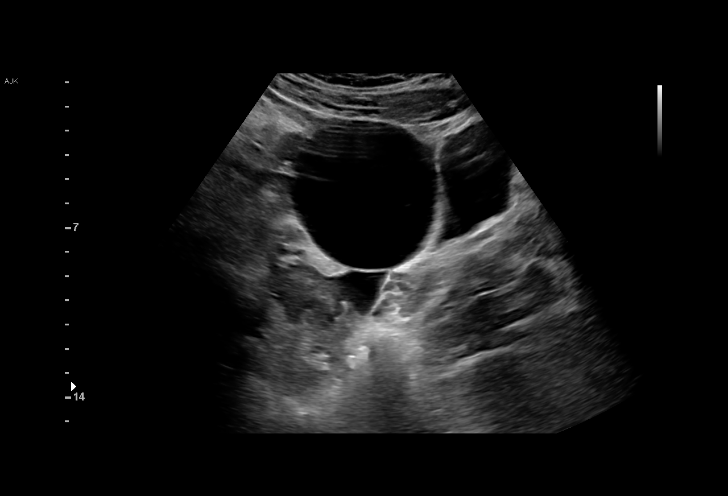
[im 11/72]
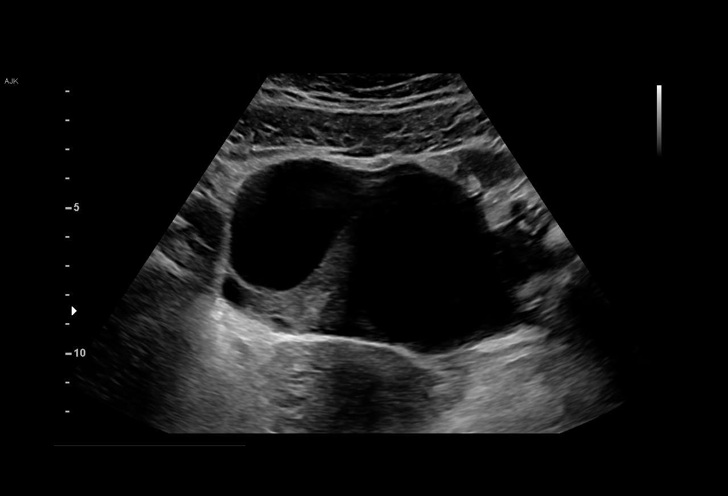
[im 16/72]
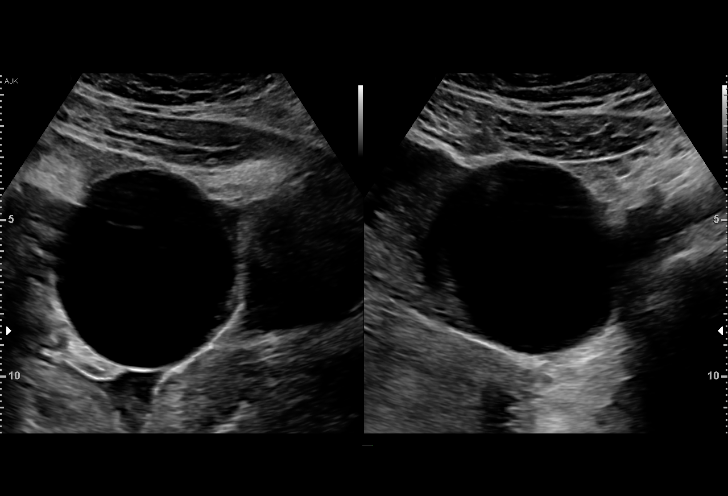
[im 22/72]
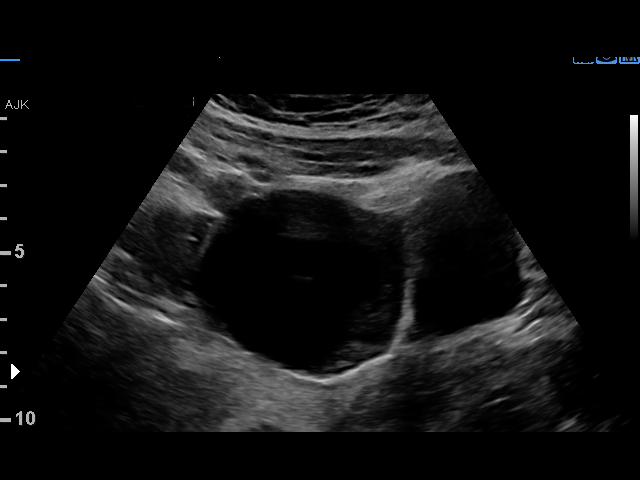
[im 27/72]
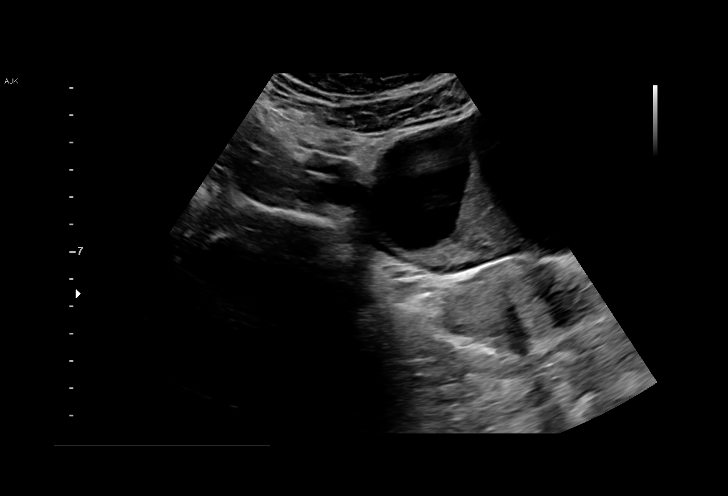
[im 32/72]
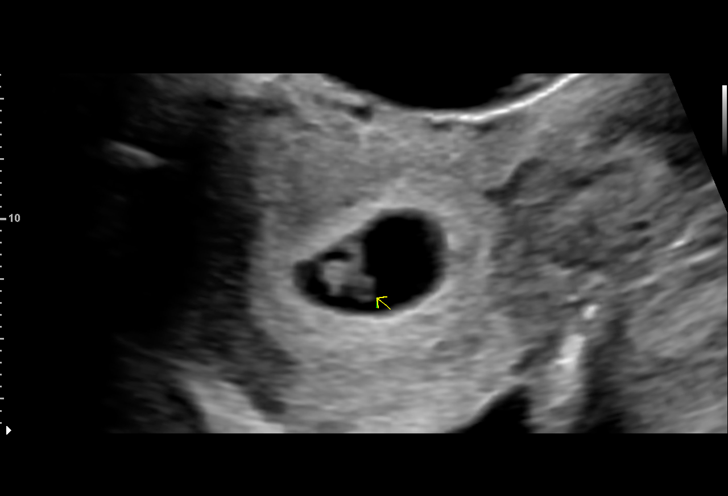
[im 37/72]
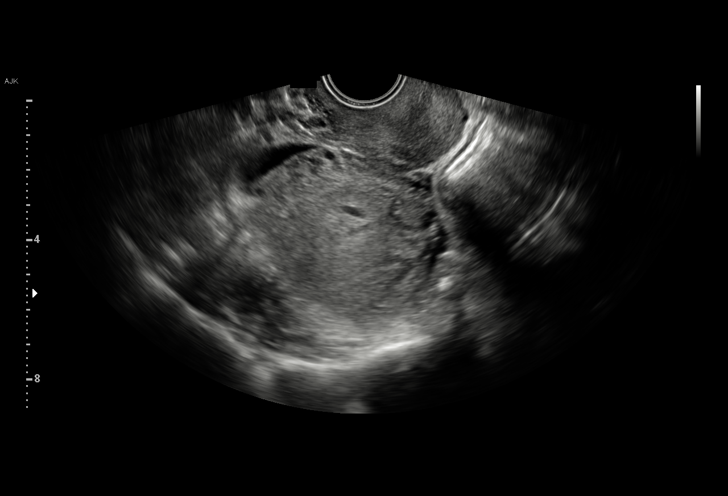
[im 40/72]
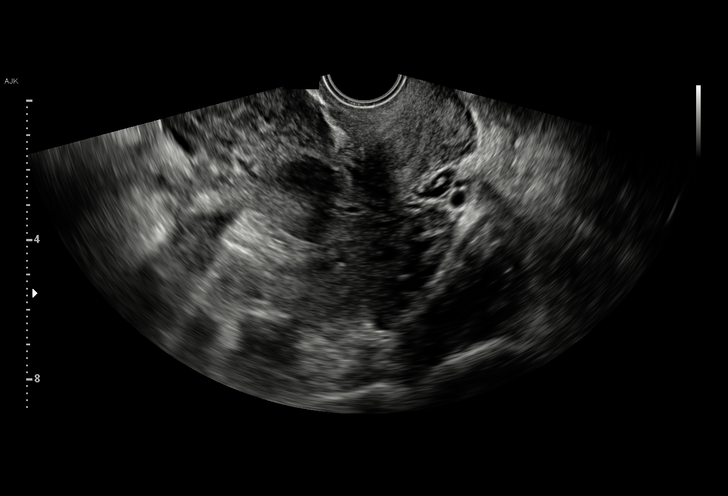
[im 45/72]
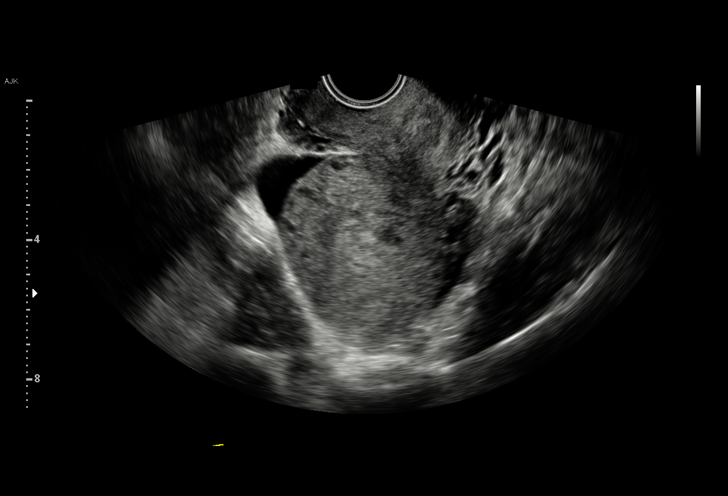
[im 50/72]
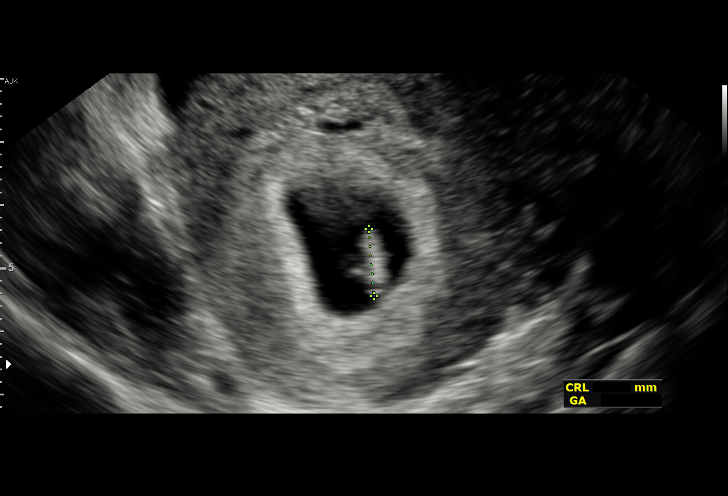
[im 56/72]
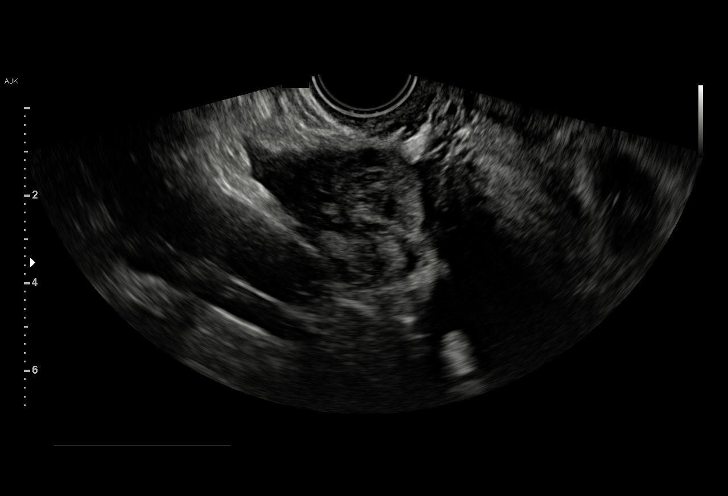
[im 61/72]
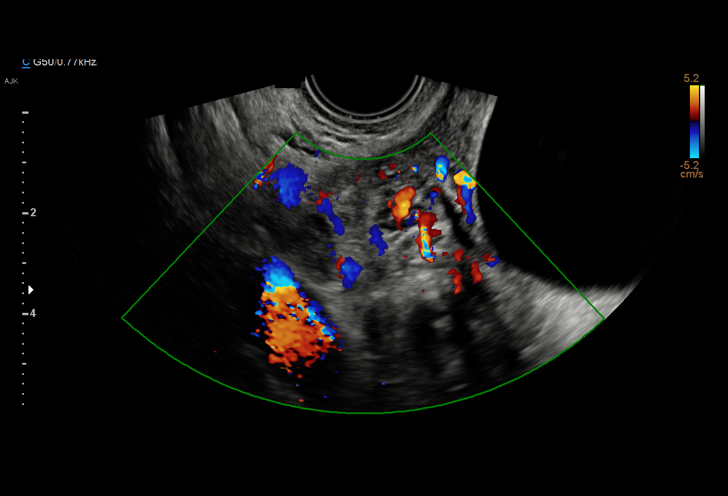
[im 66/72]
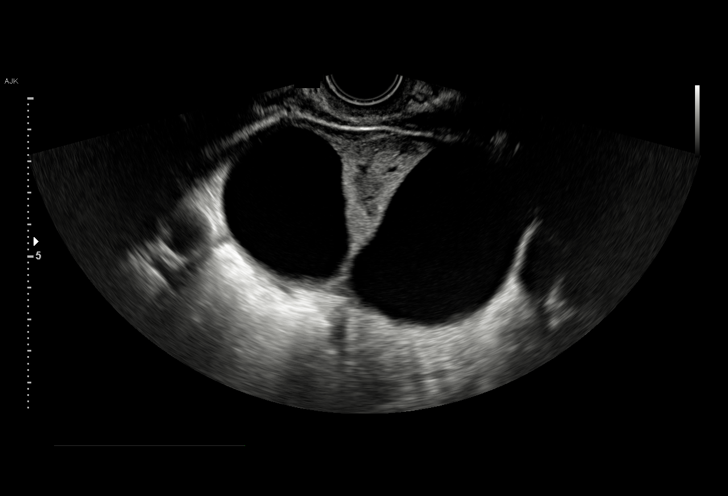
[im 72/72]
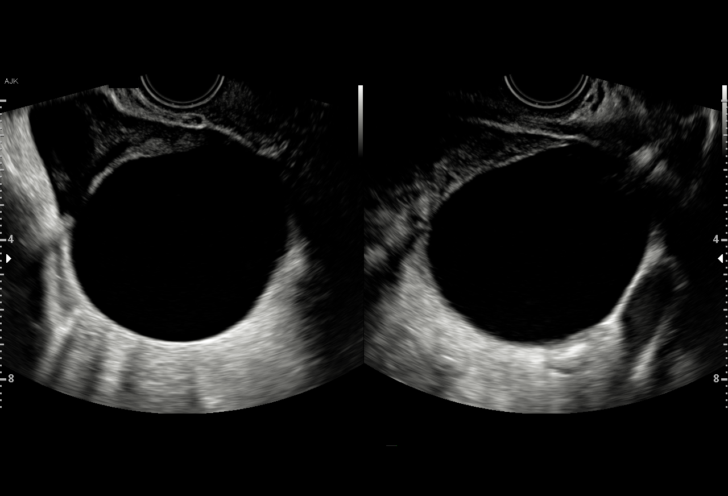

[15 of 28 positions shown; findings below may reference images not displayed]

FINDINGS: Intrauterine gestational sac: Present

Yolk sac:  Present

Embryo:  Present

Cardiac Activity: Present

Heart Rate: 137 bpm

CRL:   10.6 mm   7 w 1 d                  US EDC: 10/31/2020

Subchorionic hemorrhage:  None visualized.

Maternal uterus/adnexae: Degenerating corpus luteum cyst in the
right ovary. 2 anechoic lesions with increased through transmission
in the left ovary, compatible with simple cysts, measuring 5.9 x
x 6.3 cm and 6.5 x 5.6 x 6.3 cm. No significant free fluid in the
cul-de-sac.
IMPRESSION: 1. Single viable IUP with estimated gestational age of 7 weeks and 1
day and fetal heart rate of 137 beats per minute.
2. Degenerating corpus luteum cyst in the right ovary.
3. 2 large left ovarian cysts, as above.

## 2022-03-14 ENCOUNTER — Other Ambulatory Visit: Payer: Self-pay

## 2022-03-14 ENCOUNTER — Encounter (HOSPITAL_BASED_OUTPATIENT_CLINIC_OR_DEPARTMENT_OTHER): Payer: Self-pay | Admitting: *Deleted

## 2022-03-14 ENCOUNTER — Emergency Department (HOSPITAL_BASED_OUTPATIENT_CLINIC_OR_DEPARTMENT_OTHER)
Admission: EM | Admit: 2022-03-14 | Discharge: 2022-03-14 | Disposition: A | Discharge status: Home / Self Care | Payer: BLUE CROSS/BLUE SHIELD | Attending: Emergency Medicine | Admitting: Emergency Medicine

## 2022-03-14 DIAGNOSIS — J028 Acute pharyngitis due to other specified organisms: Secondary | ICD-10-CM | POA: Insufficient documentation

## 2022-03-14 DIAGNOSIS — B9789 Other viral agents as the cause of diseases classified elsewhere: Secondary | ICD-10-CM | POA: Insufficient documentation

## 2022-03-14 DIAGNOSIS — Z20822 Contact with and (suspected) exposure to covid-19: Secondary | ICD-10-CM | POA: Diagnosis not present

## 2022-03-14 DIAGNOSIS — J029 Acute pharyngitis, unspecified: Secondary | ICD-10-CM | POA: Diagnosis present

## 2022-03-14 LAB — GROUP A STREP BY PCR: Group A Strep by PCR: NOT DETECTED

## 2022-03-14 LAB — RESP PANEL BY RT-PCR (FLU A&B, COVID) ARPGX2
Influenza A by PCR: NEGATIVE
Influenza B by PCR: NEGATIVE
SARS Coronavirus 2 by RT PCR: NEGATIVE

## 2022-03-14 MED ORDER — ACETAMINOPHEN 325 MG PO TABS
650.0000 mg | ORAL_TABLET | Freq: Once | ORAL | Status: AC | PRN
  Administered 2022-03-14: 650 mg via ORAL
  Filled 2022-03-14: qty 2

## 2022-03-14 MED ORDER — LIDOCAINE VISCOUS HCL 2 % MT SOLN
15.0000 mL | Freq: Once | OROMUCOSAL | Status: AC
  Administered 2022-03-14: 15 mL via OROMUCOSAL
  Filled 2022-03-14: qty 15

## 2022-03-14 MED ORDER — DEXAMETHASONE 4 MG PO TABS
10.0000 mg | ORAL_TABLET | Freq: Once | ORAL | Status: AC
  Administered 2022-03-14: 10 mg via ORAL
  Filled 2022-03-14: qty 3

## 2022-03-14 NOTE — ED Triage Notes (Signed)
Pt c/o sore throat that started four days ago. Fever started yesterday. Has had chills. Took ibuprofen and tylenol at 2130 last night. States her heart rate has been elevated.

## 2022-03-14 NOTE — ED Provider Notes (Addendum)
[facility] DEPT Provider Note  CSN: [id number] Arrival date & time: 03/14/22 0536  Chief Complaint(s) Sore Throat  HPI Christy Castro is a 31 y.o. female    The history is provided by the patient.  Sore Throat This is a new problem. Episode onset: 4 days. The problem occurs constantly. The problem has been gradually worsening. Pertinent negatives include no chest pain, no abdominal pain and no shortness of breath. The symptoms are aggravated by swallowing. Nothing relieves the symptoms. She has tried acetaminophen for the symptoms.   Reports that daughter had an ear infection recently. Daughter was previously exposed to strep, but tested negative. 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 She is down visiting family in the area.  Past Medical History Past Medical History:  Diagnosis Date   Pituitary tumor    There are no problems to display for this patient.  Home Medication(s) Prior to Admission medications   Not on File                                                                                                                                    Allergies Patient has no known allergies.  Review of Systems Review of Systems  Respiratory:  Negative for shortness of breath.   Cardiovascular:  Negative for chest pain.  Gastrointestinal:  Negative for abdominal pain.   As noted in HPI  Physical Exam Vital Signs  I have reviewed the triage vital signs BP 112/78   Pulse (!) 110   Temp (!) 102.8 F (39.3 C) (Oral)   Resp 20   Ht '5\' 9"'$  (1.753 m)   Wt 95.3 kg   LMP 02/25/2022 (Approximate)   SpO2 97%   BMI 31.01 kg/m   Physical Exam Vitals reviewed.  Constitutional:      General: She is not in acute distress.    Appearance: She is well-developed. She is not diaphoretic.  HENT:     Head: Normocephalic and atraumatic.     Right Ear: Tympanic membrane and external ear normal.     Left Ear: Tympanic  membrane and external ear normal.     Nose: Nose normal.     Mouth/Throat:     Mouth: No angioedema.     Pharynx: Posterior oropharyngeal erythema present.     Tonsils: Tonsillar exudate present. No tonsillar abscesses. 1+ on the right. 2+ on the left.  Eyes:     General: No scleral icterus.    Conjunctiva/sclera: Conjunctivae normal.  Neck:     Trachea: Phonation normal.  Cardiovascular:     Rate and Rhythm: Normal rate and regular rhythm.  Pulmonary:     Effort: Pulmonary effort is normal. No respiratory distress.     Breath sounds: No stridor.  Abdominal:     General: There is no distension.  Musculoskeletal:        General:  Normal range of motion.     Cervical back: Normal range of motion.  Neurological:     Mental Status: She is alert and oriented to person, place, and time.  Psychiatric:        Behavior: Behavior normal.     ED Results and Treatments Labs (all labs ordered are listed, but only abnormal results are displayed) Labs Reviewed  GROUP A STREP BY PCR  RESP PANEL BY RT-PCR (FLU A&B, COVID) [id number]                                                                                                                         EKG  EKG Interpretation  Date/Time:    Ventricular Rate:    PR Interval:    QRS Duration:   QT Interval:    QTC Calculation:   R Axis:     Text Interpretation:         Radiology No results found.  Pertinent labs & imaging results that were available during my care of the patient were reviewed by me and considered in my medical decision making (see MDM for details).  Medications Ordered in ED Medications  acetaminophen (TYLENOL) tablet 650 mg (650 mg Oral Given 03/14/22 0610)  lidocaine (XYLOCAINE) 2 % viscous mouth solution 15 mL (15 mLs Mouth/Throat Given 03/14/22 0618)  dexamethasone (DECADRON) tablet 10 mg (10 mg Oral Given 03/14/22 0618)                                                                                                                                      Procedures Procedures  (including critical care time)  Medical Decision Making / ED Course    Complexity of Problem:  Patient's presenting problem/concern, DDX, and MDM listed below: Sore throat Viral versus bacterial. No signs concerning for PTA or RPA.  Doubt epiglottitis.    Complexity of Data:   Laboratory Tests ordered listed below with my independent interpretation: Rapid strep negative COVID/influenza negative    ED Course:    Assessment, Add'l Intervention, and Reassessment: Pharyngitis Likely viral, pending COVID/influenza.    Final Clinical Impression(s) / ED Diagnoses Final diagnoses:  Pharyngitis due to other organism   The patient appears reasonably screened and/or stabilized for discharge and I doubt any other medical condition or other [facility] requiring further screening, evaluation, or treatment in the ED at this time prior to discharge. Safe  for discharge with strict return precautions.  Disposition: Discharge  Condition: Good  I have discussed the results, Dx and Tx plan with the patient/family who expressed understanding and agree(s) with the plan. Discharge instructions discussed at length. The patient/family was given strict return precautions who verbalized understanding of the instructions. No further questions at time of discharge.    ED Discharge Orders     None       Follow Up: Primary care provider  Call  in 3-5 days, if symptoms do not improve or  worsen           This chart was dictated using voice recognition software.  Despite best efforts to proofread,  errors can occur which can change the documentation meaning.      Fatima Blank, MD 03/14/22 [phone]
